# Patient Record
Sex: Male | Born: 1965
Health system: Southern US, Community
[De-identification: ages and names within clinical notes are randomized; demographics above are authoritative.]

## PROBLEM LIST (undated history)

## (undated) DIAGNOSIS — M7500 Adhesive capsulitis of unspecified shoulder: Secondary | ICD-10-CM

## (undated) DIAGNOSIS — K8010 Calculus of gallbladder with chronic cholecystitis without obstruction: Secondary | ICD-10-CM

## (undated) DIAGNOSIS — F419 Anxiety disorder, unspecified: Secondary | ICD-10-CM

## (undated) DIAGNOSIS — K5900 Constipation, unspecified: Secondary | ICD-10-CM

## (undated) DIAGNOSIS — K219 Gastro-esophageal reflux disease without esophagitis: Secondary | ICD-10-CM

## (undated) DIAGNOSIS — D6851 Activated protein C resistance: Principal | ICD-10-CM

## (undated) DIAGNOSIS — T7840XA Allergy, unspecified, initial encounter: Secondary | ICD-10-CM

## (undated) DIAGNOSIS — K649 Unspecified hemorrhoids: Secondary | ICD-10-CM

## (undated) DIAGNOSIS — D649 Anemia, unspecified: Secondary | ICD-10-CM

## (undated) HISTORY — DX: Allergy, unspecified, initial encounter: T78.40XA

## (undated) HISTORY — DX: Adhesive capsulitis of unspecified shoulder: M75.00

## (undated) HISTORY — PX: GALLBLADDER SURGERY: SHX652

## (undated) HISTORY — PX: OTHER SURGICAL HISTORY: SHX169

## (undated) HISTORY — DX: Activated protein C resistance: D68.51

## (undated) HISTORY — PX: WISDOM TOOTH EXTRACTION: SHX21

## (undated) HISTORY — DX: Unspecified hemorrhoids: K64.9

## (undated) HISTORY — DX: Anxiety disorder, unspecified: F41.9

## (undated) HISTORY — DX: Gastro-esophageal reflux disease without esophagitis: K21.9

## (undated) HISTORY — PX: ANKLE FRACTURE SURGERY: SHX122

---

## 2004-11-22 ENCOUNTER — Ambulatory Visit: Payer: Self-pay | Admitting: Family Medicine

## 2005-03-17 ENCOUNTER — Ambulatory Visit: Payer: Self-pay | Admitting: Family Medicine

## 2005-12-01 ENCOUNTER — Ambulatory Visit: Payer: Self-pay | Admitting: Family Medicine

## 2006-05-18 ENCOUNTER — Ambulatory Visit: Payer: Self-pay | Admitting: Family Medicine

## 2006-07-18 ENCOUNTER — Ambulatory Visit: Payer: Self-pay | Admitting: Family Medicine

## 2006-09-10 ENCOUNTER — Ambulatory Visit: Payer: Self-pay | Admitting: Family Medicine

## 2006-12-24 ENCOUNTER — Ambulatory Visit: Payer: Self-pay | Admitting: Family Medicine

## 2008-02-04 ENCOUNTER — Ambulatory Visit: Payer: Self-pay | Admitting: Family Medicine

## 2008-02-04 DIAGNOSIS — J309 Allergic rhinitis, unspecified: Secondary | ICD-10-CM | POA: Insufficient documentation

## 2008-02-04 DIAGNOSIS — N453 Epididymo-orchitis: Secondary | ICD-10-CM | POA: Insufficient documentation

## 2008-02-19 ENCOUNTER — Ambulatory Visit: Payer: Self-pay | Admitting: Family Medicine

## 2008-02-19 DIAGNOSIS — M25559 Pain in unspecified hip: Secondary | ICD-10-CM | POA: Insufficient documentation

## 2008-03-12 ENCOUNTER — Ambulatory Visit: Payer: Self-pay | Admitting: Family Medicine

## 2008-03-12 DIAGNOSIS — K219 Gastro-esophageal reflux disease without esophagitis: Secondary | ICD-10-CM | POA: Insufficient documentation

## 2008-03-12 DIAGNOSIS — R109 Unspecified abdominal pain: Secondary | ICD-10-CM | POA: Insufficient documentation

## 2008-03-12 DIAGNOSIS — F411 Generalized anxiety disorder: Secondary | ICD-10-CM | POA: Insufficient documentation

## 2008-03-12 LAB — CONVERTED CEMR LAB
Bilirubin Urine: NEGATIVE
Nitrite: NEGATIVE
Urobilinogen, UA: 0.2

## 2008-03-16 ENCOUNTER — Telehealth: Payer: Self-pay | Admitting: Family Medicine

## 2008-03-17 ENCOUNTER — Encounter: Payer: Self-pay | Admitting: Family Medicine

## 2008-03-17 LAB — CONVERTED CEMR LAB
Alkaline Phosphatase: 66 units/L (ref 39–117)
Basophils Absolute: 0 10*3/uL (ref 0.0–0.1)
Bilirubin, Direct: 0.1 mg/dL (ref 0.0–0.3)
Calcium: 9.2 mg/dL (ref 8.4–10.5)
Cholesterol: 194 mg/dL (ref 0–200)
GFR calc Af Amer: 120 mL/min
GFR calc non Af Amer: 99 mL/min
Glucose, Bld: 92 mg/dL (ref 70–99)
HCT: 41.7 % (ref 39.0–52.0)
HDL: 39.5 mg/dL (ref >39.0)
Hemoglobin: 13.8 g/dL (ref 13.0–17.0)
LDL Cholesterol: 139 mg/dL — ABNORMAL HIGH (ref 0–99)
MCHC: 33.2 g/dL (ref 30.0–36.0)
Monocytes Absolute: 0.4 10*3/uL (ref 0.2–0.7)
Monocytes Relative: 11.4 % — ABNORMAL HIGH (ref 3.0–11.0)
Neutro Abs: 1.6 10*3/uL (ref 1.4–7.7)
Platelets: 240 10*3/uL (ref 150–400)
Potassium: 4.1 meq/L (ref 3.5–5.1)
RDW: 15.7 % — ABNORMAL HIGH (ref 11.5–14.6)
Sodium: 136 meq/L (ref 135–145)
Total Bilirubin: 0.7 mg/dL (ref 0.3–1.2)
Total CHOL/HDL Ratio: 4.9
Triglycerides: 76 mg/dL (ref 0–149)

## 2008-03-19 ENCOUNTER — Ambulatory Visit: Payer: Self-pay | Admitting: Internal Medicine

## 2008-03-19 DIAGNOSIS — S40029A Contusion of unspecified upper arm, initial encounter: Secondary | ICD-10-CM | POA: Insufficient documentation

## 2008-04-22 ENCOUNTER — Encounter: Payer: Self-pay | Admitting: Family Medicine

## 2008-06-11 ENCOUNTER — Encounter (INDEPENDENT_AMBULATORY_CARE_PROVIDER_SITE_OTHER): Payer: Self-pay | Admitting: Urology

## 2008-06-11 ENCOUNTER — Ambulatory Visit (HOSPITAL_BASED_OUTPATIENT_CLINIC_OR_DEPARTMENT_OTHER): Admission: RE | Admit: 2008-06-11 | Discharge: 2008-06-11 | Payer: Self-pay | Admitting: Urology

## 2008-10-14 ENCOUNTER — Encounter: Payer: Self-pay | Admitting: Family Medicine

## 2008-10-21 ENCOUNTER — Ambulatory Visit: Payer: Self-pay | Admitting: Family Medicine

## 2008-10-21 DIAGNOSIS — R209 Unspecified disturbances of skin sensation: Secondary | ICD-10-CM | POA: Insufficient documentation

## 2008-12-18 HISTORY — PX: TESTICLE SURGERY: SHX794

## 2009-04-09 ENCOUNTER — Ambulatory Visit: Payer: Self-pay | Admitting: Family Medicine

## 2009-04-09 DIAGNOSIS — IMO0002 Reserved for concepts with insufficient information to code with codable children: Secondary | ICD-10-CM | POA: Insufficient documentation

## 2009-06-09 ENCOUNTER — Ambulatory Visit: Payer: Self-pay | Admitting: Family Medicine

## 2009-06-09 DIAGNOSIS — R1031 Right lower quadrant pain: Secondary | ICD-10-CM | POA: Insufficient documentation

## 2009-06-11 ENCOUNTER — Ambulatory Visit: Payer: Self-pay | Admitting: Cardiology

## 2009-09-09 ENCOUNTER — Telehealth: Payer: Self-pay | Admitting: Family Medicine

## 2009-11-25 ENCOUNTER — Ambulatory Visit: Payer: Self-pay | Admitting: Family Medicine

## 2009-11-25 DIAGNOSIS — J069 Acute upper respiratory infection, unspecified: Secondary | ICD-10-CM | POA: Insufficient documentation

## 2010-10-17 ENCOUNTER — Ambulatory Visit: Payer: Self-pay | Admitting: Family Medicine

## 2011-01-19 NOTE — Assessment & Plan Note (Signed)
Summary: cough//ccm   Vital Signs:  Patient profile:   45 year old male Weight:      174 pounds O2 Sat:      98 % Temp:     98.5 degrees F Pulse rate:   93 / minute BP sitting:   118 / 80  (left arm)  Vitals Entered By: Pura Spice, RN (October 17, 2010 3:21 PM) CC: cough hoarse no stuffiness  Is Patient Diabetic? No   History of Present Illness: Here for 2 reasons. First he wants a refill of Alprazolam. He does not use this often. It has helped but he would like a stronger dose if possible. Also, for 5 days he has had some HAs, PND, St, hoarseness, and a dry cough. No fever.   Allergies: 1)  ! Penicillin 2)  ! * Cashews  Past History:  Past Medical History: Reviewed history from 03/12/2008 and no changes required. Allergic rhinitis GERD gynecomastia  Review of Systems  The patient denies anorexia, fever, weight loss, weight gain, vision loss, decreased hearing, hoarseness, chest pain, syncope, dyspnea on exertion, peripheral edema, hemoptysis, abdominal pain, melena, hematochezia, severe indigestion/heartburn, hematuria, incontinence, genital sores, muscle weakness, suspicious skin lesions, transient blindness, difficulty walking, depression, unusual weight change, abnormal bleeding, enlarged lymph nodes, angioedema, breast masses, and testicular masses.    Physical Exam  General:  Well-developed,well-nourished,in no acute distress; alert,appropriate and cooperative throughout examination Head:  Normocephalic and atraumatic without obvious abnormalities. No apparent alopecia or balding. Eyes:  No corneal or conjunctival inflammation noted. EOMI. Perrla. Funduscopic exam benign, without hemorrhages, exudates or papilledema. Vision grossly normal. Ears:  External ear exam shows no significant lesions or deformities.  Otoscopic examination reveals clear canals, tympanic membranes are intact bilaterally without bulging, retraction, inflammation or discharge. Hearing is grossly  normal bilaterally. Nose:  External nasal examination shows no deformity or inflammation. Nasal mucosa are pink and moist without lesions or exudates. Mouth:  Oral mucosa and oropharynx without lesions or exudates.  Teeth in good repair. Neck:  No deformities, masses, or tenderness noted. Lungs:  Normal respiratory effort, chest expands symmetrically. Lungs are clear to auscultation, no crackles or wheezes. Psych:  Oriented X3, memory intact for recent and remote, normally interactive, good eye contact, and slightly anxious.     Impression & Recommendations:  Problem # 1:  URI (ICD-465.9)  The following medications were removed from the medication list:    Hydromet 5-1.5 Mg/48ml Syrp (Hydrocodone-homatropine) .Marland Kitchen... 1 or 2 tsps at bedtime as needed His updated medication list for this problem includes:    Allegra-d 12 Hour 60-120 Mg Tb12 (Fexofenadine-pseudoephedrine) .Marland Kitchen... 1 by mouth once daily  Problem # 2:  GENERALIZED ANXIETY DISORDER (ICD-300.02)  The following medications were removed from the medication list:    Alprazolam 0.25 Mg Tabs (Alprazolam) .Marland Kitchen... Three times a day as needed anxiety His updated medication list for this problem includes:    Alprazolam 0.5 Mg Tabs (Alprazolam) .Marland Kitchen... Three times a day as needed anxiety  Complete Medication List: 1)  Allegra-d 12 Hour 60-120 Mg Tb12 (Fexofenadine-pseudoephedrine) .Marland Kitchen.. 1 by mouth once daily 2)  Alprazolam 0.5 Mg Tabs (Alprazolam) .... Three times a day as needed anxiety  Patient Instructions: 1)  Please schedule a follow-up appointment as needed .  Prescriptions: ALPRAZOLAM 0.5 MG TABS (ALPRAZOLAM) three times a day as needed anxiety  #60 x 2   Entered and Authorized by:   Nelwyn Salisbury MD   Signed by:   Nelwyn Salisbury MD  on 10/17/2010   Method used:   Print then Give to Patient   RxID:   [id number]    Orders Added: 1)  Est. Patient Level IV [14782]

## 2011-02-03 ENCOUNTER — Encounter: Payer: Self-pay | Admitting: Family Medicine

## 2011-02-03 ENCOUNTER — Ambulatory Visit (INDEPENDENT_AMBULATORY_CARE_PROVIDER_SITE_OTHER): Payer: BC Managed Care – PPO | Admitting: Family Medicine

## 2011-02-03 VITALS — BP 106/74 | HR 88 | Temp 98.5°F | Wt 180.0 lb

## 2011-02-03 DIAGNOSIS — N508 Other specified disorders of male genital organs: Secondary | ICD-10-CM

## 2011-02-03 DIAGNOSIS — N5089 Other specified disorders of the male genital organs: Secondary | ICD-10-CM

## 2011-02-03 NOTE — Progress Notes (Signed)
  Subjective:    Patient ID: Jackson Smith, male    DOB: 08-12-1966, 45 y.o.   MRN: [id number]  HPI Here to check a nontender lump he found near the left testicle several days ago. He has never noticed this before. It does not bother him at all. He did have surgery in this area years ago to correct poor circulation to the testicle.    Review of Systems  Constitutional: Negative.   Gastrointestinal: Negative.   Genitourinary: Positive for scrotal swelling. Negative for dysuria, urgency, penile swelling, difficulty urinating, penile pain and testicular pain.       Objective:   Physical Exam  Constitutional: He appears well-developed and well-nourished.  Genitourinary: Penis normal. Right testis shows no mass, no swelling and no tenderness. Right testis is descended. Cremasteric reflex is not absent on the right side. Left testis shows mass. Left testis shows no swelling and no tenderness. Left testis is descended. Cremasteric reflex is not absent on the left side.       There is a smooth nonmobile nontender cystic mass along the left medial testicle          Assessment & Plan:  This is consistent with an epididymal cyst. We will get an [place] to confirm

## 2011-02-10 ENCOUNTER — Ambulatory Visit
Admission: RE | Admit: 2011-02-10 | Discharge: 2011-02-10 | Disposition: A | Discharge status: Home / Self Care | Payer: BC Managed Care – PPO | Source: Ambulatory Visit | Attending: Family Medicine | Admitting: Family Medicine

## 2011-02-10 DIAGNOSIS — N5089 Other specified disorders of the male genital organs: Secondary | ICD-10-CM

## 2011-02-16 ENCOUNTER — Telehealth: Payer: Self-pay | Admitting: *Deleted

## 2011-02-16 NOTE — Telephone Encounter (Signed)
Left message to call back  

## 2011-02-16 NOTE — Telephone Encounter (Signed)
Message copied by Tor Netters on Thu Feb 16, 2011  3:51 PM ------      Message from: Dwaine Deter      Created: Mon Feb 13, 2011 11:23 AM       Normal with a small hydrocele (fluid sac) on the left side, as expected. No treatment needed.

## 2011-02-17 NOTE — Telephone Encounter (Signed)
Pt aware of results 

## 2011-02-17 NOTE — Telephone Encounter (Signed)
Left message to call back  

## 2011-03-03 ENCOUNTER — Encounter: Payer: Self-pay | Admitting: Family Medicine

## 2011-03-06 ENCOUNTER — Other Ambulatory Visit: Payer: BC Managed Care – PPO

## 2011-03-06 ENCOUNTER — Other Ambulatory Visit: Payer: Self-pay | Admitting: Family Medicine

## 2011-03-06 ENCOUNTER — Encounter (INDEPENDENT_AMBULATORY_CARE_PROVIDER_SITE_OTHER): Payer: Self-pay | Admitting: *Deleted

## 2011-03-06 DIAGNOSIS — Z0389 Encounter for observation for other suspected diseases and conditions ruled out: Secondary | ICD-10-CM

## 2011-03-06 DIAGNOSIS — E785 Hyperlipidemia, unspecified: Secondary | ICD-10-CM

## 2011-03-06 DIAGNOSIS — Z0000 Encounter for general adult medical examination without abnormal findings: Secondary | ICD-10-CM

## 2011-03-06 LAB — TSH: TSH: 1.84 u[IU]/mL (ref 0.35–5.50)

## 2011-03-06 LAB — URINALYSIS
Leukocytes, UA: NEGATIVE
Nitrite: NEGATIVE
Specific Gravity, Urine: >=1.03 (ref 1.000–1.030)
Urine Glucose: NEGATIVE
Urobilinogen, UA: 0.2 (ref 0.0–1.0)

## 2011-03-06 LAB — CBC WITH DIFFERENTIAL/PLATELET
Basophils Absolute: 0 10*3/uL (ref 0.0–0.1)
HCT: 36.6 % — ABNORMAL LOW (ref 39.0–52.0)
Hemoglobin: 12.2 g/dL — ABNORMAL LOW (ref 13.0–17.0)
Lymphs Abs: 1.5 10*3/uL (ref 0.7–4.0)
MCHC: 33.4 g/dL (ref 30.0–36.0)
MCV: 83.8 fl (ref 78.0–100.0)
Monocytes Absolute: 0.4 10*3/uL (ref 0.1–1.0)
Monocytes Relative: 9.8 % (ref 3.0–12.0)
Neutro Abs: 2 10*3/uL (ref 1.4–7.7)
Platelets: 235 10*3/uL (ref 150.0–400.0)
RDW: 16.6 % — ABNORMAL HIGH (ref 11.5–14.6)

## 2011-03-06 LAB — BASIC METABOLIC PANEL
BUN: 14 mg/dL (ref 6–23)
CO2: 29 mEq/L (ref 19–32)
Chloride: 101 mEq/L (ref 96–112)
GFR: 98.54 mL/min (ref >60.00)
Glucose, Bld: 88 mg/dL (ref 70–99)
Potassium: 4.3 mEq/L (ref 3.5–5.1)
Sodium: 135 mEq/L (ref 135–145)

## 2011-03-06 LAB — LDL CHOLESTEROL, DIRECT: Direct LDL: 124.5 mg/dL

## 2011-03-06 LAB — PSA: PSA: 0.3 ng/mL (ref 0.10–4.00)

## 2011-03-06 LAB — HEPATIC FUNCTION PANEL
ALT: 16 U/L (ref 0–53)
AST: 18 U/L (ref 0–37)
Alkaline Phosphatase: 61 U/L (ref 39–117)
Total Bilirubin: 0.3 mg/dL (ref 0.3–1.2)

## 2011-03-06 LAB — LIPID PANEL
Total CHOL/HDL Ratio: 3
VLDL: 15.6 mg/dL (ref 0.0–40.0)

## 2011-03-13 ENCOUNTER — Encounter: Payer: Self-pay | Admitting: Family Medicine

## 2011-03-13 ENCOUNTER — Telehealth: Payer: Self-pay

## 2011-03-13 ENCOUNTER — Ambulatory Visit (INDEPENDENT_AMBULATORY_CARE_PROVIDER_SITE_OTHER): Payer: BC Managed Care – PPO | Admitting: Family Medicine

## 2011-03-13 VITALS — BP 110/64 | HR 88 | Ht 67.5 in | Wt 176.0 lb

## 2011-03-13 DIAGNOSIS — Z0000 Encounter for general adult medical examination without abnormal findings: Secondary | ICD-10-CM

## 2011-03-13 NOTE — Telephone Encounter (Signed)
Message copied by Madison Hickman on Mon Mar 13, 2011 10:03 AM ------      Message from: Dwaine Deter      Created: Fri Mar 10, 2011  5:35 PM       Normal except mildly high chol. Watch the diet

## 2011-03-13 NOTE — Progress Notes (Signed)
  Subjective:    Patient ID: Jackson Smith, male    DOB: 28-Dec-1965, 45 y.o.   MRN: [id number]  HPI 45 yr old male for a cpx. He feels well with no complaints. He seldom uses any Xanax, and he sleeps well.    Review of Systems  Constitutional: Negative.   HENT: Negative.   Eyes: Negative.   Respiratory: Negative.   Cardiovascular: Negative.   Gastrointestinal: Negative.   Genitourinary: Negative.   Musculoskeletal: Negative.   Skin: Negative.   Neurological: Negative.   Hematological: Negative.   Psychiatric/Behavioral: Negative.        Objective:   Physical Exam  Constitutional: He is oriented to person, place, and time. He appears well-developed and well-nourished. No distress.  HENT:  Head: Normocephalic and atraumatic.  Right Ear: External ear normal.  Left Ear: External ear normal.  Nose: Nose normal.  Mouth/Throat: Oropharynx is clear and moist. No oropharyngeal exudate.  Eyes: Conjunctivae and EOM are normal. Pupils are equal, round, and reactive to light. Right eye exhibits no discharge. Left eye exhibits no discharge. No scleral icterus.  Neck: Neck supple. No JVD present. No tracheal deviation present. No thyromegaly present.  Cardiovascular: Normal rate, regular rhythm, normal heart sounds and intact distal pulses.  Exam reveals no gallop and no friction rub.   No murmur heard. Pulmonary/Chest: Effort normal and breath sounds normal. No respiratory distress. He has no wheezes. He has no rales. He exhibits no tenderness.  Abdominal: Soft. Bowel sounds are normal. He exhibits no distension and no mass. There is no tenderness. There is no rebound and no guarding.  Genitourinary: Rectum normal, prostate normal and penis normal. Guaiac negative stool. No penile tenderness.  Musculoskeletal: Normal range of motion. He exhibits no edema and no tenderness.  Lymphadenopathy:    He has no cervical adenopathy.  Neurological: He is alert and oriented to person, place, and  time. He has normal reflexes. No cranial nerve deficit. He exhibits normal muscle tone. Coordination normal.  Skin: Skin is warm and dry. No rash noted. He is not diaphoretic. No erythema. No pallor.  Psychiatric: He has a normal mood and affect. His behavior is normal. Judgment and thought content normal.          Assessment & Plan:  Discussed diet and exercise.

## 2011-03-13 NOTE — Telephone Encounter (Signed)
Pt aware labs

## 2011-05-02 NOTE — Op Note (Signed)
NAMEFREAD, Jackson Smith             ACCOUNT NO.:  [id number]   MEDICAL RECORD NO.:  [id number]          PATIENT TYPE:  AMB   LOCATION:  [facility]                         FACILITY:  [facility]   PHYSICIAN:  Sigmund I. Patsi Sears, M.D.DATE OF BIRTH:  [birth year]   DATE OF PROCEDURE:  06/11/2008  DATE OF DISCHARGE:                               OPERATIVE REPORT   PREOPERATIVE DIAGNOSES:  Chronic left testicular pain secondary to left  varicocele.   POSTOPERATIVE DIAGNOSES:  Chronic left testicular pain secondary to left  varicocele.   OPERATION:  Left internal splenic vein ligation.   SURGEON:  Sigmund I. Patsi Sears, M.D.   ANESTHESIA:  General LMA.   PREPARATION:  After appropriate preanesthesia, the patient was brought  to the operating room, placed on the operating room table in dorsal  supine position where general LMA anesthesia was induced.  The left  scrotum and inguinal canal were shaved, prepped with Betadine solution,  and draped in usual fashion.  The patient is given IV antibiotic prior  to the incision.   REVIEW OF HISTORY:  This 45 year old male has history of left groin  pain, after he has been on his feet all day.  Evaluation with ultrasound  shows the patient has left varicocele present.  The patient is now for  left internal splenic vein ligation.   PROCEDURE:  The left inguinal canal was marked preoperatively.  At  surgery, line of incision was made, two fingerbreadths medial to the  iliac crest on the left side, and the left scrotum.  Incision measures  approximately 4 to 5 cm, beginning at the pubic tubercle.  Subcutaneous  tissue was dissected with electrosurgery unit, and several large veins  were identified, and these were ligated with 3-0 Vicryl sutures.  The  spermatic cord is identified, external to the inguinal ring.  At the  level of pubic tubercle, the spermatic cord was dissected, and a Penrose  drain placed underneath it.  The vas was identified within the  spermatic  cord, and this was isolated.  Pulsating artery is identified at the  beginning of the procedure, and at the end of the procedure as well.   The spermatic cord is then dissected, and multiple large veins  identified.  These were individually ligated and a specimen of internal  splenic vein removed, sent to the laboratory for examination.   Marcaine 0.25 plain is injected proximally and distally in the spermatic  cord, and in the wound edges.   The Scarpa's fascia was then closed with 3-0 Vicryl suture, and the  wound is closed with skin stapler.  Sterile dressings applied, the  patient was awakened and taken to recovery room in good condition.      Sigmund I. Patsi Sears, M.D.  Electronically Signed     SIT/MEDQ  D:  06/11/2008  T:  06/11/2008  Job:  [id number]   cc:   Jeannett Senior A. Clent Ridges, MD  [address] [place]  [place] [address]

## 2011-05-29 ENCOUNTER — Encounter: Payer: Self-pay | Admitting: Internal Medicine

## 2011-05-29 ENCOUNTER — Ambulatory Visit (INDEPENDENT_AMBULATORY_CARE_PROVIDER_SITE_OTHER): Payer: BC Managed Care – PPO | Admitting: Internal Medicine

## 2011-05-29 DIAGNOSIS — J309 Allergic rhinitis, unspecified: Secondary | ICD-10-CM

## 2011-05-29 DIAGNOSIS — J069 Acute upper respiratory infection, unspecified: Secondary | ICD-10-CM

## 2011-05-29 NOTE — Patient Instructions (Signed)
ZUTRIPRO  1 teaspoon every 6-8 hours for cough  Get plenty of rest, Drink lots of  clear liquids, and use Tylenol or ibuprofen for fever and discomfort.    Call or return to clinic prn if these symptoms worsen or fail to improve as anticipated.

## 2011-05-29 NOTE — Progress Notes (Signed)
  Subjective:    Patient ID: Jackson Smith, male    DOB: [birth year], 45 y.o.   MRN: [id number]  HPI  45 year old patient who has a history of allergic rhinitis. For the past 4 days has had dry nonproductive cough some nasal congestion and mild postnasal drip. There's been no fever. He has been using Allegra-D with some benefit. She complained his cough    Review of Systems  Constitutional: Negative for fever, chills, appetite change and fatigue.  HENT: Positive for rhinorrhea and postnasal drip. Negative for hearing loss, ear pain, congestion, sore throat, trouble swallowing, neck stiffness, dental problem, voice change and tinnitus.   Eyes: Negative for pain, discharge and visual disturbance.  Respiratory: Positive for cough. Negative for chest tightness, wheezing and stridor.   Cardiovascular: Negative for chest pain, palpitations and leg swelling.  Gastrointestinal: Negative for nausea, vomiting, abdominal pain, diarrhea, constipation, blood in stool and abdominal distention.  Genitourinary: Negative for urgency, hematuria, flank pain, discharge, difficulty urinating and genital sores.  Musculoskeletal: Negative for myalgias, back pain, joint swelling, arthralgias and gait problem.  Skin: Negative for rash.  Neurological: Negative for dizziness, syncope, speech difficulty, weakness, numbness and headaches.  Hematological: Negative for adenopathy. Does not bruise/bleed easily.  Psychiatric/Behavioral: Negative for behavioral problems and dysphoric mood. The patient is not nervous/anxious.        Objective:   Physical Exam  Constitutional: He is oriented to person, place, and time. He appears well-developed.  HENT:  Head: Normocephalic.  Right Ear: External ear normal.  Left Ear: External ear normal.  Eyes: Conjunctivae and EOM are normal.  Neck: Normal range of motion.  Cardiovascular: Normal rate and normal heart sounds.   Pulmonary/Chest: Breath sounds normal.  Abdominal:  Bowel sounds are normal.  Musculoskeletal: Normal range of motion. He exhibits no edema and no tenderness.  Neurological: He is alert and oriented to person, place, and time.  Psychiatric: He has a normal mood and affect. His behavior is normal.          Assessment & Plan:   Viral URI. Will treat symptomatically with expectorants and antitussives. Tylenol when necessary

## 2011-08-16 ENCOUNTER — Encounter: Payer: Self-pay | Admitting: Family Medicine

## 2011-08-16 ENCOUNTER — Ambulatory Visit (INDEPENDENT_AMBULATORY_CARE_PROVIDER_SITE_OTHER): Payer: BC Managed Care – PPO | Admitting: Family Medicine

## 2011-08-16 VITALS — BP 106/76 | HR 80 | Temp 98.1°F | Wt 179.0 lb

## 2011-08-16 DIAGNOSIS — G629 Polyneuropathy, unspecified: Secondary | ICD-10-CM

## 2011-08-16 DIAGNOSIS — G589 Mononeuropathy, unspecified: Secondary | ICD-10-CM

## 2011-08-16 DIAGNOSIS — K5900 Constipation, unspecified: Secondary | ICD-10-CM

## 2011-08-16 NOTE — Progress Notes (Signed)
  Subjective:    Patient ID: Jackson Smith, male    DOB: [birth year], 45 y.o.   MRN: [id number]  HPI Here for several days of odd flushing sensations in his face, hands, and thighs. No visible redness or rashes, no sweats. No recent medication changes. He also mentions several months of fatigue, which is unusual for him. He also asks about help with constipation from time to time. He has tried fiber cereals, Metamucil, and MOM but these do not help much. He tends to feel bloated a lot. No heartburn or nausea.    Review of Systems  Constitutional: Positive for fatigue. Negative for fever, chills, diaphoresis and unexpected weight change.  Respiratory: Negative.   Cardiovascular: Negative.   Gastrointestinal: Positive for constipation.  Skin: Negative.   Neurological: Negative.        Objective:   Physical Exam  Constitutional: He appears well-developed and well-nourished.  Neck: No thyromegaly present.  Cardiovascular: Normal rate, regular rhythm, normal heart sounds and intact distal pulses.   Pulmonary/Chest: Effort normal and breath sounds normal.  Abdominal: Soft. Bowel sounds are normal. He exhibits no distension and no mass. There is no tenderness. There is no rebound and no guarding.  Lymphadenopathy:    He has no cervical adenopathy.  Neurological: He is alert. No cranial nerve deficit. He exhibits normal muscle tone. Coordination normal.  Skin: Skin is warm and dry. No rash noted. No erythema. No pallor.          Assessment & Plan:  Try Miralax and Align  daily. Check labs including B12 for sources of neuropathy

## 2011-08-17 LAB — BASIC METABOLIC PANEL
BUN: 13 mg/dL (ref 6–23)
Calcium: 9.3 mg/dL (ref 8.4–10.5)
Creatinine, Ser: 1 mg/dL (ref 0.4–1.5)
GFR: 90.11 mL/min (ref >60.00)

## 2011-08-17 LAB — VITAMIN B12: Vitamin B-12: 472 pg/mL (ref 211–911)

## 2011-08-17 LAB — TSH: TSH: 1.15 u[IU]/mL (ref 0.35–5.50)

## 2011-08-18 ENCOUNTER — Telehealth: Payer: Self-pay | Admitting: Family Medicine

## 2011-08-18 NOTE — Telephone Encounter (Signed)
Requesting his recent lab results. Thanks.

## 2011-08-18 NOTE — Telephone Encounter (Signed)
Labs put in mail today °

## 2011-08-22 NOTE — Progress Notes (Signed)
Left a message for pt to return call 

## 2011-09-13 ENCOUNTER — Ambulatory Visit (INDEPENDENT_AMBULATORY_CARE_PROVIDER_SITE_OTHER): Payer: BC Managed Care – PPO | Admitting: Family Medicine

## 2011-09-13 ENCOUNTER — Encounter: Payer: Self-pay | Admitting: Family Medicine

## 2011-09-13 VITALS — BP 110/78 | HR 75 | Temp 98.5°F | Wt 180.0 lb

## 2011-09-13 DIAGNOSIS — J039 Acute tonsillitis, unspecified: Secondary | ICD-10-CM

## 2011-09-13 MED ORDER — LEVOFLOXACIN 500 MG PO TABS: 500.0000 mg | ORAL_TABLET | Freq: Every day | ORAL | Status: AC

## 2011-09-13 NOTE — Progress Notes (Signed)
  Subjective:    Patient ID: Jackson Smith, male    DOB: 1966/08/07, 45 y.o.   MRN: [id number]  HPI Here for 10 days of a ST on the left side of the throat. No fever or HA or cough. Using Motrin.    Review of Systems  Constitutional: Negative.   HENT: Positive for sore throat. Negative for congestion, postnasal drip and sinus pressure.   Eyes: Negative.   Respiratory: Negative.        Objective:   Physical Exam  Constitutional: He appears well-developed and well-nourished.  HENT:  Right Ear: External ear normal.  Left Ear: External ear normal.  Nose: Nose normal.  Mouth/Throat: No oropharyngeal exudate.       The left tonsil is swollen and red   Neck: Normal range of motion. Neck supple. No thyromegaly present.  Pulmonary/Chest: Effort normal and breath sounds normal.  Lymphadenopathy:    He has no cervical adenopathy.          Assessment & Plan:  Recheck prn

## 2011-09-14 LAB — POCT HEMOGLOBIN-HEMACUE: Operator id: 268271

## 2011-11-07 ENCOUNTER — Telehealth: Payer: Self-pay | Admitting: Family Medicine

## 2011-11-07 NOTE — Telephone Encounter (Signed)
LMTCB

## 2011-11-07 NOTE — Telephone Encounter (Signed)
Advised Urgent Care.

## 2011-11-07 NOTE — Telephone Encounter (Signed)
Pt called and said that he is having pain in chest when he exhales or inhales. No cough or congestion. This started today. Pt says that he feels fine,otherwise. Pls call asap.

## 2012-03-06 ENCOUNTER — Other Ambulatory Visit (INDEPENDENT_AMBULATORY_CARE_PROVIDER_SITE_OTHER): Payer: BC Managed Care – PPO

## 2012-03-06 DIAGNOSIS — Z0000 Encounter for general adult medical examination without abnormal findings: Secondary | ICD-10-CM

## 2012-03-06 LAB — CBC WITH DIFFERENTIAL/PLATELET
Eosinophils Relative: 2.6 % (ref 0.0–5.0)
Monocytes Relative: 10.7 % (ref 3.0–12.0)
Neutrophils Relative %: 42.7 % — ABNORMAL LOW (ref 43.0–77.0)
Platelets: 229 10*3/uL (ref 150.0–400.0)
WBC: 3.5 10*3/uL — ABNORMAL LOW (ref 4.5–10.5)

## 2012-03-06 LAB — POCT URINALYSIS DIPSTICK
Protein, UA: NEGATIVE
Spec Grav, UA: 1.025
Urobilinogen, UA: 0.2
pH, UA: 6

## 2012-03-06 LAB — LIPID PANEL
Cholesterol: 195 mg/dL (ref 0–200)
HDL: 53.1 mg/dL (ref >39.00)
LDL Cholesterol: 130 mg/dL — ABNORMAL HIGH (ref 0–99)
VLDL: 11.8 mg/dL (ref 0.0–40.0)

## 2012-03-06 LAB — HEPATIC FUNCTION PANEL
ALT: 26 U/L (ref 0–53)
AST: 25 U/L (ref 0–37)
Bilirubin, Direct: 0 mg/dL (ref 0.0–0.3)
Total Bilirubin: 0.5 mg/dL (ref 0.3–1.2)

## 2012-03-06 LAB — BASIC METABOLIC PANEL
BUN: 15 mg/dL (ref 6–23)
Calcium: 8.9 mg/dL (ref 8.4–10.5)
Creatinine, Ser: 0.8 mg/dL (ref 0.4–1.5)
GFR: 110.94 mL/min (ref >60.00)

## 2012-03-07 ENCOUNTER — Telehealth: Payer: Self-pay | Admitting: Family Medicine

## 2012-03-07 NOTE — Progress Notes (Signed)
Quick Note:  Left a message for pt to return call. ______ 

## 2012-03-07 NOTE — Telephone Encounter (Signed)
Pt is callback concerning blood work results

## 2012-03-08 ENCOUNTER — Encounter: Payer: Self-pay | Admitting: Family Medicine

## 2012-03-08 NOTE — Telephone Encounter (Signed)
Tried to reach pt, no answer. I put a copy of results in mail.

## 2012-03-08 NOTE — Progress Notes (Signed)
Quick Note:  Tried to reach pt by phone, no answer, put a copy of results in mail. ______ 

## 2012-03-13 ENCOUNTER — Encounter: Payer: Self-pay | Admitting: Family Medicine

## 2012-03-13 ENCOUNTER — Ambulatory Visit (INDEPENDENT_AMBULATORY_CARE_PROVIDER_SITE_OTHER): Payer: BC Managed Care – PPO | Admitting: Family Medicine

## 2012-03-13 VITALS — BP 112/78 | HR 80 | Temp 98.7°F | Ht 68.5 in | Wt 182.0 lb

## 2012-03-13 DIAGNOSIS — Z0000 Encounter for general adult medical examination without abnormal findings: Secondary | ICD-10-CM

## 2012-03-13 MED ORDER — ALPRAZOLAM 0.5 MG PO TABS: 0.5000 mg | ORAL_TABLET | Freq: Two times a day (BID) | ORAL | Status: DC | PRN

## 2012-03-13 NOTE — Progress Notes (Signed)
  Subjective:    Patient ID: Jackson Smith, male    DOB: 28-Dec-1965, 46 y.o.   MRN: [id number]  HPI 46 yr old male for a cpx. He feels well and has no concerns. He walks 3 miles a day and watched his diet.    Review of Systems  Constitutional: Negative.   HENT: Negative.   Eyes: Negative.   Respiratory: Negative.   Cardiovascular: Negative.   Gastrointestinal: Negative.   Genitourinary: Negative.   Musculoskeletal: Negative.   Skin: Negative.   Neurological: Negative.   Hematological: Negative.   Psychiatric/Behavioral: Negative.        Objective:   Physical Exam  Constitutional: He is oriented to person, place, and time. He appears well-developed and well-nourished. No distress.  HENT:  Head: Normocephalic and atraumatic.  Right Ear: External ear normal.  Left Ear: External ear normal.  Nose: Nose normal.  Mouth/Throat: Oropharynx is clear and moist. No oropharyngeal exudate.  Eyes: Conjunctivae and EOM are normal. Pupils are equal, round, and reactive to light. Right eye exhibits no discharge. Left eye exhibits no discharge. No scleral icterus.  Neck: Neck supple. No JVD present. No tracheal deviation present. No thyromegaly present.  Cardiovascular: Normal rate, regular rhythm, normal heart sounds and intact distal pulses.  Exam reveals no gallop and no friction rub.   No murmur heard. Pulmonary/Chest: Effort normal and breath sounds normal. No respiratory distress. He has no wheezes. He has no rales. He exhibits no tenderness.  Abdominal: Soft. Bowel sounds are normal. He exhibits no distension and no mass. There is no tenderness. There is no rebound and no guarding.  Genitourinary: Rectum normal, prostate normal and penis normal. Guaiac negative stool. No penile tenderness.  Musculoskeletal: Normal range of motion. He exhibits no edema and no tenderness.  Lymphadenopathy:    He has no cervical adenopathy.  Neurological: He is alert and oriented to person, place, and  time. He has normal reflexes. No cranial nerve deficit. He exhibits normal muscle tone. Coordination normal.  Skin: Skin is warm and dry. No rash noted. He is not diaphoretic. No erythema. No pallor.  Psychiatric: He has a normal mood and affect. His behavior is normal. Judgment and thought content normal.          Assessment & Plan:  Well exam.

## 2012-05-21 ENCOUNTER — Ambulatory Visit (INDEPENDENT_AMBULATORY_CARE_PROVIDER_SITE_OTHER): Payer: BC Managed Care – PPO | Admitting: Family Medicine

## 2012-05-21 ENCOUNTER — Encounter: Payer: Self-pay | Admitting: Family Medicine

## 2012-05-21 VITALS — BP 110/76 | HR 85 | Temp 97.9°F | Wt 180.0 lb

## 2012-05-21 DIAGNOSIS — J40 Bronchitis, not specified as acute or chronic: Secondary | ICD-10-CM

## 2012-05-21 MED ORDER — HYDROCODONE-HOMATROPINE 5-1.5 MG/5ML PO SYRP: 5.0000 mL | ORAL_SOLUTION | ORAL | Status: AC | PRN

## 2012-05-21 MED ORDER — AZITHROMYCIN 250 MG PO TABS: ORAL_TABLET | ORAL | Status: DC

## 2012-05-21 NOTE — Progress Notes (Signed)
  Subjective:    Patient ID: Jackson Smith, male    DOB: October 22, 1966, 46 y.o.   MRN: [id number]  HPI Here for 4 days of chest tightness and coughing up yellow sputum. No fever. Drinking fluids.    Review of Systems  Constitutional: Negative.   HENT: Negative.   Eyes: Negative.   Respiratory: Positive for cough, chest tightness and wheezing.        Objective:   Physical Exam  Constitutional: He appears well-developed and well-nourished.  Pulmonary/Chest: Effort normal. No respiratory distress. He has no rales.       Scattered wheezes and rhonchi           Assessment & Plan:  Add Mucinex

## 2012-05-30 ENCOUNTER — Ambulatory Visit (INDEPENDENT_AMBULATORY_CARE_PROVIDER_SITE_OTHER): Payer: BC Managed Care – PPO | Admitting: Family Medicine

## 2012-05-30 ENCOUNTER — Encounter: Payer: Self-pay | Admitting: Family Medicine

## 2012-05-30 VITALS — BP 116/74 | HR 87 | Temp 98.4°F | Wt 180.0 lb

## 2012-05-30 DIAGNOSIS — K645 Perianal venous thrombosis: Secondary | ICD-10-CM

## 2012-05-30 NOTE — Progress Notes (Signed)
  Subjective:    Patient ID: Jackson Smith, male    DOB: May 31, 1966, 46 y.o.   MRN: [id number]  HPI Here for a firm non-tender lump he discovered near his anus while taking a shower yesterday. No pain or bleeding. He did have to strain to push out a BM 2 days ago.    Review of Systems  Constitutional: Negative.   Gastrointestinal: Negative.        Objective:   Physical Exam  Constitutional: He appears well-developed and well-nourished.  Genitourinary:       Small non-tender thrombosed external hemorrhoid at the anal verge           Assessment & Plan:  Thrombosed hemorrhoid. Since this is not symptomatic we decided against lancing it today. He will use Preparation H and warm sitz baths. Recheck prn

## 2012-09-20 ENCOUNTER — Ambulatory Visit (INDEPENDENT_AMBULATORY_CARE_PROVIDER_SITE_OTHER): Payer: BC Managed Care – PPO | Admitting: Family Medicine

## 2012-09-20 ENCOUNTER — Encounter: Payer: Self-pay | Admitting: Family Medicine

## 2012-09-20 VITALS — BP 108/68 | HR 92 | Temp 98.4°F | Wt 185.0 lb

## 2012-09-20 DIAGNOSIS — M5430 Sciatica, unspecified side: Secondary | ICD-10-CM

## 2012-09-20 DIAGNOSIS — D6859 Other primary thrombophilia: Secondary | ICD-10-CM

## 2012-09-20 DIAGNOSIS — D6851 Activated protein C resistance: Secondary | ICD-10-CM

## 2012-09-20 DIAGNOSIS — R350 Frequency of micturition: Secondary | ICD-10-CM

## 2012-09-20 LAB — POCT URINALYSIS DIPSTICK
Ketones, UA: NEGATIVE
Protein, UA: NEGATIVE
Spec Grav, UA: 1.025
Urobilinogen, UA: 0.2
pH, UA: 6

## 2012-09-20 MED ORDER — DICLOFENAC SODIUM 75 MG PO TBEC: 75.0000 mg | DELAYED_RELEASE_TABLET | Freq: Two times a day (BID) | ORAL | Status: DC

## 2012-09-20 NOTE — Addendum Note (Signed)
Addended by: Aniceto Boss A on: 09/20/2012 12:50 PM   Modules accepted: Orders

## 2012-09-20 NOTE — Progress Notes (Signed)
  Subjective:    Patient ID: Jackson Smith, male    DOB: 19-Mar-1966, 46 y.o.   MRN: [id number]  HPI Here for 3 days of intermittent dull pains in the left buttock which radiate down the left leg. No back pain. No recent trauma.    Review of Systems  Constitutional: Negative.   Gastrointestinal: Negative.   Genitourinary: Negative.   Musculoskeletal: Positive for myalgias. Negative for back pain, joint swelling, arthralgias and gait problem.       Objective:   Physical Exam  Constitutional: He appears well-developed and well-nourished. No distress.  Musculoskeletal:       The lower spine is unremarkable. Tender over the left sciatic notch. Negative SLR          Assessment & Plan:  Rest, heat, Diclofenac prn.

## 2012-11-01 ENCOUNTER — Telehealth: Payer: Self-pay | Admitting: Family Medicine

## 2012-11-01 DIAGNOSIS — Z832 Family history of diseases of the blood and blood-forming organs and certain disorders involving the immune mechanism: Secondary | ICD-10-CM

## 2012-11-01 NOTE — Telephone Encounter (Signed)
Tell him I have put in the order so all he needs to do is schedule the blood draw

## 2012-11-01 NOTE — Telephone Encounter (Signed)
Called pt and schd ordered lab for 11/08/12 as noted.

## 2012-11-01 NOTE — Telephone Encounter (Signed)
Pt called and is req to get an order to have a Factor 5 test done. Pt has a family hx for blood clotting disorder. Pt is req to get this done on next Friday 11/08/12. Pls advise.

## 2012-11-08 ENCOUNTER — Other Ambulatory Visit (INDEPENDENT_AMBULATORY_CARE_PROVIDER_SITE_OTHER): Payer: BC Managed Care – PPO

## 2012-11-08 DIAGNOSIS — Z832 Family history of diseases of the blood and blood-forming organs and certain disorders involving the immune mechanism: Secondary | ICD-10-CM

## 2012-11-12 LAB — FACTOR 5 LEIDEN

## 2012-11-13 NOTE — Progress Notes (Signed)
Quick Note:  I left voice message with results. ______ 

## 2012-11-13 NOTE — Addendum Note (Signed)
Addended by: Gershon Crane A on: 11/13/2012 12:35 PM   Modules accepted: Orders

## 2012-11-15 ENCOUNTER — Telehealth: Payer: Self-pay | Admitting: Oncology

## 2012-11-15 NOTE — Telephone Encounter (Signed)
S/W pt in re NP appt 12/16 @ 3 w/Dr. Gaylyn Rong.  Referring Dr. Clent Ridges Dx-Factor 5 Welcome packet mailed.

## 2012-11-15 NOTE — Telephone Encounter (Signed)
LVOM for pt to return call.  °

## 2012-11-18 ENCOUNTER — Telehealth: Payer: Self-pay | Admitting: Oncology

## 2012-11-18 NOTE — Telephone Encounter (Signed)
C/D 11/18/12 for appt.12/02/12

## 2012-11-30 ENCOUNTER — Encounter: Payer: Self-pay | Admitting: Oncology

## 2012-11-30 DIAGNOSIS — D6851 Activated protein C resistance: Secondary | ICD-10-CM

## 2012-11-30 HISTORY — DX: Activated protein C resistance: D68.51

## 2012-12-02 ENCOUNTER — Encounter: Payer: Self-pay | Admitting: Oncology

## 2012-12-02 ENCOUNTER — Ambulatory Visit: Payer: BC Managed Care – PPO

## 2012-12-02 ENCOUNTER — Other Ambulatory Visit (HOSPITAL_BASED_OUTPATIENT_CLINIC_OR_DEPARTMENT_OTHER): Payer: BC Managed Care – PPO | Admitting: Lab

## 2012-12-02 ENCOUNTER — Ambulatory Visit (HOSPITAL_BASED_OUTPATIENT_CLINIC_OR_DEPARTMENT_OTHER): Payer: BC Managed Care – PPO | Admitting: Oncology

## 2012-12-02 VITALS — BP 117/73 | HR 70 | Temp 97.0°F | Resp 18 | Ht 67.0 in | Wt 185.6 lb

## 2012-12-02 DIAGNOSIS — D6859 Other primary thrombophilia: Secondary | ICD-10-CM

## 2012-12-02 DIAGNOSIS — D6851 Activated protein C resistance: Secondary | ICD-10-CM

## 2012-12-02 LAB — CBC WITH DIFFERENTIAL/PLATELET
Basophils Absolute: 0 10*3/uL (ref 0.0–0.1)
EOS%: 1.4 % (ref 0.0–7.0)
HCT: 39.7 % (ref 38.4–49.9)
HGB: 13.8 g/dL (ref 13.0–17.1)
LYMPH%: 28.8 % (ref 14.0–49.0)
MCH: 30.8 pg (ref 27.2–33.4)
MCV: 88.5 fL (ref 79.3–98.0)
NEUT%: 59.9 % (ref 39.0–75.0)
Platelets: 228 10*3/uL (ref 140–400)
lymph#: 1.7 10*3/uL (ref 0.9–3.3)

## 2012-12-02 LAB — MORPHOLOGY

## 2012-12-02 NOTE — Progress Notes (Signed)
Checked in new pt with no financial concerns. °

## 2012-12-02 NOTE — Progress Notes (Signed)
[facility]  Telephone:([phone]) [phone] [phone]) [phone]     INITIAL HEMATOLOGY CONSULTATION    Referral MD:   Dr. Tera Mater. Clent Ridges, M.D.  Reason for Referral:  Personal history of heterozygous factor V Leiden.   HPI:   [name] is a 46 year-old man without significant PMH.  His maternal aunt had DVT and was tested positive for  factor V Leiden.  Therefore, patient's relatives were advised to have it tested.  His brother and mother tested positive for  factor V Leiden. It was not clear whether it was homozygous or heterozygous.  He was kindly referred to the [facility] for evaluation.   [name] presented to the Clinic by himself today.  He reported feeling well.  He never had leg/calf swelling or pain or chest pain or SOB.   Patient denies fever, anorexia, weight loss, fatigue, headache, visual changes, confusion, drenching night sweats, palpable lymph node swelling, mucositis, odynophagia, dysphagia, nausea vomiting, jaundice, chest pain, palpitation, shortness of breath, dyspnea on exertion, productive cough, gum bleeding, epistaxis, hematemesis, hemoptysis, abdominal pain, abdominal swelling, early satiety, melena, hematuria, skin rash, spontaneous bleeding, joint swelling, joint pain, heat or cold intolerance, bowel bladder incontinence, back pain, focal motor weakness, paresthesia, depression, suicidal or homicidal ideation, feeling hopelessness.  He occasionally see blood on toilet paper when he has constipation and strains for bowel movement.      Past Medical History  Diagnosis Date  . Allergy     seasonal  . Anxiety     panic attack  . GERD (gastroesophageal reflux disease)   . Factor V Leiden mutation 11/30/2012  . Hemorrhoid   :    Past Surgical History  Procedure Date  . Testicle surgery 2010    decreased blood flow    :   CURRENT MEDS: Current Outpatient Prescriptions  Medication Sig Dispense Refill  . ALPRAZolam (XANAX) 0.5  MG tablet Take 1 tablet (0.5 mg total) by mouth 2 (two) times daily as needed for anxiety.  60 tablet  5  . diclofenac (VOLTAREN) 75 MG EC tablet Take 75 mg by mouth as needed.      . fexofenadine-pseudoephedrine (ALLEGRA-D 24) 180-240 MG per 24 hr tablet Take 1 tablet by mouth as needed.           Allergies  Allergen Reactions  . Cashew Nut Oil   . Penicillins   :  Family History  Problem Relation Age of Onset  . Diabetes Father   . Heart attack Father   . Birth defects Brother   . Factor V Leiden deficiency Brother   . Factor V Leiden deficiency Mother   . Factor V Leiden deficiency Maternal Aunt   . Cancer Maternal Uncle 60    colon cancer  :  History   Social History  . Marital Status: Single    Spouse Name: N/A    Number of Children: 0  . Years of Education: N/A   Occupational History  .  [employer]   Social History Main Topics  . Smoking status: Never Smoker   . Smokeless tobacco: Never Used  . Alcohol Use: No  . Drug Use: No  . Sexually Active: Not on file   Other Topics Concern  . Not on file   Social History Narrative  . No narrative on file  :  REVIEW OF SYSTEM:  The rest of the 14-point review of sytem was negative.   Exam: ECOG 0.  General:  well-nourished in no acute distress.  Eyes:  no scleral icterus.  ENT:  There were no oropharyngeal lesions.  Neck was without thyromegaly.  Lymphatics:  Negative cervical, supraclavicular or axillary adenopathy.  Respiratory: lungs were clear bilaterally without wheezing or crackles.  Cardiovascular:  Regular rate and rhythm, S1/S2, without murmur, rub or gallop.  There was no pedal edema.  GI:  abdomen was soft, flat, nontender, nondistended, without organomegaly.  Muscoloskeletal:  no spinal tenderness of palpation of vertebral spine.  Skin exam was without echymosis, petichae.  Neuro exam was nonfocal.  Patient was able to get on and off exam table without assistance.  Gait was normal.  Patient was  alerted and oriented.  Attention was good.   Language was appropriate.  Mood was normal without depression.  Speech was not pressured.  Thought content was not tangential.    LABS:  Lab Results  Component Value Date   WBC 5.9 12/02/2012   HGB 13.8 12/02/2012   HCT 39.7 12/02/2012   PLT 228 12/02/2012   GLUCOSE 89 03/06/2012   CHOL 195 03/06/2012   TRIG 59.0 03/06/2012   HDL 53.10 03/06/2012   LDLDIRECT 124.5 03/06/2011   LDLCALC 130* 03/06/2012   ALT 26 03/06/2012   AST 25 03/06/2012   NA 137 03/06/2012   K 4.0 03/06/2012   CL 102 03/06/2012   CREATININE 0.8 03/06/2012   BUN 15 03/06/2012   CO2 25 03/06/2012   PSA 0.30 03/06/2011    Blood smear review:   I personally reviewed the patient's peripheral blood smear today.  There was isocytosis.  There was no peripheral blast.  There was no schistocytosis, spherocytosis, target cell, rouleaux formation, tear drop cell.  There was no giant platelets or platelet clumps.      ASSESSMENT AND PLAN:   1.  Issue:  Heterozygous factor V Leiden mutation (of of the two copies of this gene was mutated).  Ramification:  Slightly elevated risk of blood clot than general population.  However, without history of DVT or PE,  there is no indication for prophylactic blood thinner since the risk of blood thinner in Heterozygous factor V Leiden mutation is higher than the potential benefit of preventing blood clot.    2.  Recommendation:  Watchful observation.  I discussed with the patient the following teaching points.   *  Watch out for signs of blood clot:  Leg/calf pain/swelling; chest pain, severe shortness of breath.  *  When patient is admitted to hospital or undergo operation especially orthopedic surgery, he will need to be on prophylactic blood thinner.  *  If patient were to have daughters in the future, they may consider refraining from birth control that contain estrogen; since they have 50% chance of carrying this mutation which can significantly  increase the risk of blood clots on estrogen-containing birth control     The length of time of the face-to-face encounter was 30 minutes. More than 50% of time was spent counseling and coordination of care.     Thank you for this referral.      '

## 2012-12-02 NOTE — Patient Instructions (Addendum)
1.  Issue:  Heterozygous factor V Leiden mutation (of of the two copies of this gene was mutated).  Ramification:  Slightly elevated risk of blood clot than general population.  However, without history of blood clot, there is no indication for prophylactic blood thinner since the risk of blood thinner in Heterozygous factor V Leiden mutation is higher than the potential benefit of preventing blood clot.    2.  Recommendation:  Watchful observation.  *  Watch out for signs of blood clot:  Leg/calf pain/swelling; chest pain, severe shortness of breath.  *  When you're admitted to hospital or undergo operation especially orthopedic surgery, you will need to be on prophylactic blood thinner.  *  Your daughters may consider refraining from birth control that contain estrogen; since they have 50% chance of carrying this mutation which can significantly increase the risk of blood clots on estrogen-containing birth control

## 2012-12-25 ENCOUNTER — Encounter: Payer: Self-pay | Admitting: Family Medicine

## 2012-12-25 ENCOUNTER — Ambulatory Visit (INDEPENDENT_AMBULATORY_CARE_PROVIDER_SITE_OTHER): Payer: BC Managed Care – PPO | Admitting: Family Medicine

## 2012-12-25 VITALS — BP 108/78 | HR 89 | Temp 98.2°F | Wt 186.0 lb

## 2012-12-25 DIAGNOSIS — F411 Generalized anxiety disorder: Secondary | ICD-10-CM

## 2012-12-25 DIAGNOSIS — R209 Unspecified disturbances of skin sensation: Secondary | ICD-10-CM

## 2012-12-25 DIAGNOSIS — R202 Paresthesia of skin: Secondary | ICD-10-CM

## 2012-12-25 DIAGNOSIS — F419 Anxiety disorder, unspecified: Secondary | ICD-10-CM

## 2012-12-25 MED ORDER — ALPRAZOLAM 0.5 MG PO TABS: 0.5000 mg | ORAL_TABLET | Freq: Two times a day (BID) | ORAL | Status: DC | PRN

## 2012-12-25 NOTE — Progress Notes (Signed)
  Subjective:    Patient ID: Jackson Smith, male    DOB: 04-20-1966, 47 y.o.   MRN: [id number]  HPI Here to ask about some poorly defined sensations in the right calf he has noticed for the past week or so. There is no pain or itching or burning or numbness. He describes this as a feeling like part of his leg is "separate from the rest and hanging loose". No swelling or redness. No recent trauma. Also he asks for refill son Xanax. 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 They have had some disagreements.    Review of Systems  Respiratory: Negative.   Cardiovascular: Negative.   Musculoskeletal: Negative.   Neurological: Negative.        Objective:   Physical Exam  Constitutional: He is oriented to person, place, and time. He appears well-developed and well-nourished.  Musculoskeletal:       The right calf is normal on exam. No edema or swelling. No masses or tenderness.   Neurological: He is alert and oriented to person, place, and time. He has normal reflexes. No cranial nerve deficit. He exhibits normal muscle tone. Coordination normal.  Psychiatric: He has a normal mood and affect. His behavior is normal. Thought content normal.          Assessment & Plan:  Refilled the Xanax. I am not sure how to characterize the symptoms he is having in the calf, other than this may represent some type of irritated nerve ending in the skin. He will observe this and follow up prn

## 2013-03-21 ENCOUNTER — Ambulatory Visit (INDEPENDENT_AMBULATORY_CARE_PROVIDER_SITE_OTHER): Payer: BC Managed Care – PPO | Admitting: Family Medicine

## 2013-03-21 ENCOUNTER — Encounter: Payer: Self-pay | Admitting: Family Medicine

## 2013-03-21 VITALS — BP 110/70 | HR 76 | Temp 98.2°F | Wt 187.0 lb

## 2013-03-21 DIAGNOSIS — M546 Pain in thoracic spine: Secondary | ICD-10-CM

## 2013-03-21 LAB — POCT URINALYSIS DIPSTICK
Bilirubin, UA: NEGATIVE
Glucose, UA: NEGATIVE
Ketones, UA: NEGATIVE
Leukocytes, UA: NEGATIVE
Nitrite, UA: NEGATIVE

## 2013-03-21 MED ORDER — DICLOFENAC SODIUM 75 MG PO TBEC: 75.0000 mg | DELAYED_RELEASE_TABLET | Freq: Two times a day (BID) | ORAL | Status: DC

## 2013-03-21 MED ORDER — CYCLOBENZAPRINE HCL 10 MG PO TABS: 10.0000 mg | ORAL_TABLET | Freq: Three times a day (TID) | ORAL | Status: DC | PRN

## 2013-03-21 NOTE — Progress Notes (Signed)
  Subjective:    Patient ID: Jackson Smith, male    DOB: 04/29/1966, 47 y.o.   MRN: [id number]  HPI Here for 2 days of tightness and pain in the middle back. No recent trauma. He has a hx of low back pain that flares up from time to time. Using heat.    Review of Systems  Constitutional: Negative.   Musculoskeletal: Positive for back pain.       Objective:   Physical Exam  Constitutional: He appears well-developed and well-nourished.  Cardiovascular: Normal rate, regular rhythm, normal heart sounds and intact distal pulses.   Pulmonary/Chest: Effort normal and breath sounds normal.  Musculoskeletal:  Tender along the middle back with some spasm. Full ROM           Assessment & Plan:  Use Diclofenac and Flexeril.

## 2013-03-21 NOTE — Addendum Note (Signed)
Addended by: Aniceto Boss A on: 03/21/2013 05:00 PM   Modules accepted: Orders

## 2013-07-02 ENCOUNTER — Ambulatory Visit (INDEPENDENT_AMBULATORY_CARE_PROVIDER_SITE_OTHER): Payer: BC Managed Care – PPO | Admitting: Internal Medicine

## 2013-07-02 ENCOUNTER — Encounter: Payer: Self-pay | Admitting: Internal Medicine

## 2013-07-02 VITALS — BP 110/70 | HR 83 | Temp 97.2°F

## 2013-07-02 DIAGNOSIS — M779 Enthesopathy, unspecified: Secondary | ICD-10-CM

## 2013-07-02 NOTE — Patient Instructions (Signed)
Tendinitis  Tendinitis is swelling and inflammation of the tendons. Tendons are band-like tissues that connect muscle to bone. Tendinitis commonly occurs in the:    Shoulders (rotator cuff).   Heels (Achilles tendon).   Elbows (triceps tendon).  CAUSES  Tendinitis is usually caused by overusing the tendon, muscles, and joints involved. When the tissue surrounding a tendon (synovium) becomes inflamed, it is called tenosynovitis. Tendinitis commonly develops in people whose jobs require repetitive motions.  SYMPTOMS   Pain.   Tenderness.   Mild swelling.  DIAGNOSIS  Tendinitis is usually diagnosed by physical exam. Your caregiver may also order X-rays or other imaging tests.  TREATMENT  Your caregiver may recommend certain medicines or exercises for your treatment.  HOME CARE INSTRUCTIONS    Use a sling or splint for as long as directed by your caregiver until the pain decreases.   Put ice on the injured area.   Put ice in a plastic bag.   Place a towel between your skin and the bag.   Leave the ice on for 15-20 minutes, 3-4 times a day.   Avoid using the limb while the tendon is painful. Perform gentle range of motion exercises only as directed by your caregiver. Stop exercises if pain or discomfort increase, unless directed otherwise by your caregiver.   Only take over-the-counter or prescription medicines for pain, discomfort, or fever as directed by your caregiver.  SEEK MEDICAL CARE IF:    Your pain and swelling increase.   You develop new, unexplained symptoms, especially increased numbness in the hands.  MAKE SURE YOU:    Understand these instructions.   Will watch your condition.   Will get help right away if you are not doing well or get worse.  Document Released: 12/01/2000 Document Revised: 02/26/2012 Document Reviewed: 02/20/2011  Exit[employer] Patient Information 2014 [employer]

## 2013-07-02 NOTE — Progress Notes (Signed)
Subjective:    Patient ID: Jackson Smith, male    DOB: 1966-05-02, 47 y.o.   MRN: [id number]  HPI  Pt presents to the clinic today with c/o a right swollen calf. This started about 1 month ago. The pain is at the top of his calf. He is not sure if there is swelling or not. He denies redness or warmth, or pain when he walks. He reports the pain as achy. He did take tylenol which did help with the pain.He denies any specific injury to the area. He was recently tested for Factor V Leiden as he recently found out it ran in his family. He did test positive but was not started on prophylactic blood thinner as he had no history of clot in the past.  Review of Systems      Past Medical History  Diagnosis Date  . Allergy     seasonal  . Anxiety     panic attack  . GERD (gastroesophageal reflux disease)   . Factor V Leiden mutation 11/30/2012  . Hemorrhoid     Current Outpatient Prescriptions  Medication Sig Dispense Refill  . ALPRAZolam (XANAX) 0.5 MG tablet Take 1 tablet (0.5 mg total) by mouth 2 (two) times daily as needed for anxiety.  60 tablet  5  . cyclobenzaprine (FLEXERIL) 10 MG tablet Take 1 tablet (10 mg total) by mouth 3 (three) times daily as needed for muscle spasms.  90 tablet  5  . diclofenac (VOLTAREN) 75 MG EC tablet Take 1 tablet (75 mg total) by mouth 2 (two) times daily.  60 tablet  5  . fexofenadine-pseudoephedrine (ALLEGRA-D 24) 180-240 MG per 24 hr tablet Take 1 tablet by mouth as needed.        No current facility-administered medications for this visit.    Allergies  Allergen Reactions  . Cashew Nut Oil   . Penicillins     Family History  Problem Relation Age of Onset  . Diabetes Father   . Heart attack Father   . Birth defects Brother   . Factor V Leiden deficiency Brother   . Factor V Leiden deficiency Mother   . Factor V Leiden deficiency Maternal Aunt   . Cancer Maternal Uncle 60    colon cancer    History   Social History  . Marital  Status: Single    Spouse Name: N/A    Number of Children: 0  . Years of Education: N/A   Occupational History  .  [employer]   Social History Main Topics  . Smoking status: Never Smoker   . Smokeless tobacco: Never Used  . Alcohol Use: No  . Drug Use: No  . Sexually Active: Not on file   Other Topics Concern  . Not on file   Social History Narrative  . No narrative on file     Constitutional: Denies fever, malaise, fatigue, headache or abrupt weight changes.  Musculoskeletal: Pt reports right calf pain. Denies decrease in range of motion, difficulty with gait, or joint pain and swelling.  Skin: Denies redness, rashes, lesions or ulcercations.     No other specific complaints in a complete review of systems (except as listed in HPI above).  Objective:   Physical Exam  BP 110/70  Pulse 83  Temp(Src) 97.2 F (36.2 C) (Oral)  SpO2 96% Wt Readings from Last 3 Encounters:  03/21/13 187 lb (84.823 kg)  12/25/12 186 lb (84.369 kg)  12/02/12 185 lb 9.6 oz (84.188  kg)    General: Appears his stated age, well developed, well nourished in NAD. Skin: Warm, dry and intact. No rashes, lesions or ulcerations noted. No erythema or warmth noticed..  Cardiovascular: Normal rate and rhythm. S1,S2 noted.  No murmur, rubs or gallops noted. No JVD or BLE edema. No carotid bruits noted. Pulmonary/Chest: Normal effort and positive vesicular breath sounds. No respiratory distress. No wheezes, rales or ronchi noted.  Musculoskeletal: Right calf 39.5 cm, left calf 38.75 cm. Normal range of motion. No signs of joint swelling. No difficulty with gait. Tenderness with palpation at the tendon at the top of the gastrocnemius.    BMET    Component Value Date/Time   NA 137 03/06/2012 0831   K 4.0 03/06/2012 0831   CL 102 03/06/2012 0831   CO2 25 03/06/2012 0831   GLUCOSE 89 03/06/2012 0831   BUN 15 03/06/2012 0831   CREATININE 0.8 03/06/2012 0831   CALCIUM 8.9 03/06/2012 0831   GFRNONAA  99 03/12/2008 0945   GFRAA 120 03/12/2008 0945    Lipid Panel     Component Value Date/Time   CHOL 195 03/06/2012 0831   TRIG 59.0 03/06/2012 0831   HDL 53.10 03/06/2012 0831   CHOLHDL 4 03/06/2012 0831   VLDL 11.8 03/06/2012 0831   LDLCALC 130* 03/06/2012 0831    CBC    Component Value Date/Time   WBC 5.9 12/02/2012 1457   WBC 3.5* 03/06/2012 0831   RBC 4.49 12/02/2012 1457   RBC 4.62 03/06/2012 0831   HGB 13.8 12/02/2012 1457   HGB 14.0 03/06/2012 0831   HCT 39.7 12/02/2012 1457   HCT 41.5 03/06/2012 0831   PLT 228 12/02/2012 1457   PLT 229.0 03/06/2012 0831   MCV 88.5 12/02/2012 1457   MCV 89.8 03/06/2012 0831   MCH 30.8 12/02/2012 1457   MCHC 34.8 12/02/2012 1457   MCHC 33.7 03/06/2012 0831   RDW 13.2 12/02/2012 1457   RDW 14.1 03/06/2012 0831   LYMPHSABS 1.7 12/02/2012 1457   LYMPHSABS 1.5 03/06/2012 0831   MONOABS 0.6 12/02/2012 1457   MONOABS 0.4 03/06/2012 0831   EOSABS 0.1 12/02/2012 1457   EOSABS 0.1 03/06/2012 0831   BASOSABS 0.0 12/02/2012 1457   BASOSABS 0.0 03/06/2012 0831    Hgb A1C No results found for this basename: HGBA1C         Assessment & Plan:   Tendonitis of right leg, new onset:  Take Ibuprofen OTC Low suspicion for a clot at this time Avoid repetitive motions  RTC as needed or if symptoms persist or worsen

## 2014-02-15 DIAGNOSIS — M7500 Adhesive capsulitis of unspecified shoulder: Secondary | ICD-10-CM

## 2014-02-15 HISTORY — DX: Adhesive capsulitis of unspecified shoulder: M75.00

## 2014-07-03 ENCOUNTER — Other Ambulatory Visit (INDEPENDENT_AMBULATORY_CARE_PROVIDER_SITE_OTHER): Payer: BC Managed Care – PPO

## 2014-07-03 DIAGNOSIS — Z0000 Encounter for general adult medical examination without abnormal findings: Secondary | ICD-10-CM

## 2014-07-03 LAB — HEPATIC FUNCTION PANEL
ALBUMIN: 4.2 g/dL (ref 3.5–5.2)
ALK PHOS: 66 U/L (ref 39–117)
ALT: 21 U/L (ref 0–53)
AST: 23 U/L (ref 0–37)
BILIRUBIN TOTAL: 0.5 mg/dL (ref 0.2–1.2)
Bilirubin, Direct: 0.1 mg/dL (ref 0.0–0.3)
Total Protein: 7.1 g/dL (ref 6.0–8.3)

## 2014-07-03 LAB — CBC WITH DIFFERENTIAL/PLATELET
BASOS ABS: 0 10*3/uL (ref 0.0–0.1)
Basophils Relative: 0.2 % (ref 0.0–3.0)
EOS ABS: 0.1 10*3/uL (ref 0.0–0.7)
Eosinophils Relative: 2.1 % (ref 0.0–5.0)
HCT: 41.1 % (ref 39.0–52.0)
Hemoglobin: 13.8 g/dL (ref 13.0–17.0)
LYMPHS PCT: 39.9 % (ref 12.0–46.0)
Lymphs Abs: 1.8 10*3/uL (ref 0.7–4.0)
MCHC: 33.7 g/dL (ref 30.0–36.0)
MCV: 90.1 fl (ref 78.0–100.0)
MONOS PCT: 9.3 % (ref 3.0–12.0)
Monocytes Absolute: 0.4 10*3/uL (ref 0.1–1.0)
NEUTROS PCT: 48.5 % (ref 43.0–77.0)
Neutro Abs: 2.2 10*3/uL (ref 1.4–7.7)
PLATELETS: 254 10*3/uL (ref 150.0–400.0)
RBC: 4.56 Mil/uL (ref 4.22–5.81)
RDW: 13.2 % (ref 11.5–15.5)
WBC: 4.4 10*3/uL (ref 4.0–10.5)

## 2014-07-03 LAB — BASIC METABOLIC PANEL
BUN: 12 mg/dL (ref 6–23)
CALCIUM: 8.9 mg/dL (ref 8.4–10.5)
CO2: 27 meq/L (ref 19–32)
CREATININE: 0.9 mg/dL (ref 0.4–1.5)
Chloride: 102 mEq/L (ref 96–112)
GFR: 93.46 mL/min (ref >60.00)
GLUCOSE: 77 mg/dL (ref 70–99)
Potassium: 4.7 mEq/L (ref 3.5–5.1)
SODIUM: 136 meq/L (ref 135–145)

## 2014-07-03 LAB — LIPID PANEL
Cholesterol: 180 mg/dL (ref 0–200)
HDL: 45.4 mg/dL
LDL Cholesterol: 125 mg/dL — ABNORMAL HIGH (ref 0–99)
NonHDL: 134.6
Total CHOL/HDL Ratio: 4
Triglycerides: 47 mg/dL (ref 0.0–149.0)
VLDL: 9.4 mg/dL (ref 0.0–40.0)

## 2014-07-03 LAB — POCT URINALYSIS DIPSTICK
Bilirubin, UA: NEGATIVE
Glucose, UA: NEGATIVE
Ketones, UA: NEGATIVE
Leukocytes, UA: NEGATIVE
Nitrite, UA: NEGATIVE
Protein, UA: NEGATIVE
Spec Grav, UA: 1.01
Urobilinogen, UA: 0.2
pH, UA: 6

## 2014-07-03 LAB — TSH: TSH: 1.91 u[IU]/mL (ref 0.35–4.50)

## 2014-07-10 ENCOUNTER — Encounter: Payer: Self-pay | Admitting: Family Medicine

## 2014-07-10 ENCOUNTER — Ambulatory Visit (INDEPENDENT_AMBULATORY_CARE_PROVIDER_SITE_OTHER): Payer: BC Managed Care – PPO | Admitting: Family Medicine

## 2014-07-10 VITALS — BP 98/61 | HR 66 | Temp 98.1°F | Ht 67.0 in | Wt 182.0 lb

## 2014-07-10 DIAGNOSIS — Z0000 Encounter for general adult medical examination without abnormal findings: Secondary | ICD-10-CM

## 2014-07-10 MED ORDER — ALPRAZOLAM 0.5 MG PO TABS: 0.5000 mg | ORAL_TABLET | Freq: Two times a day (BID) | ORAL | Status: DC | PRN

## 2014-07-10 NOTE — Progress Notes (Signed)
Pre visit review using our clinic review tool, if applicable. No additional management support is needed unless otherwise documented below in the visit note. 

## 2014-07-10 NOTE — Progress Notes (Signed)
   Subjective:    Patient ID: Jackson Smith, male    DOB: 04-Sep-1966, 48 y.o.   MRN: [id number]  HPI 48 yr old male for a cpx. He feels well.    Review of Systems  Constitutional: Negative.   HENT: Negative.   Eyes: Negative.   Respiratory: Negative.   Cardiovascular: Negative.   Gastrointestinal: Negative.   Genitourinary: Negative.   Musculoskeletal: Negative.   Skin: Negative.   Neurological: Negative.   Psychiatric/Behavioral: Negative.        Objective:   Physical Exam  Constitutional: He is oriented to person, place, and time. He appears well-developed and well-nourished. No distress.  HENT:  Head: Normocephalic and atraumatic.  Right Ear: External ear normal.  Left Ear: External ear normal.  Nose: Nose normal.  Mouth/Throat: Oropharynx is clear and moist. No oropharyngeal exudate.  Eyes: Conjunctivae and EOM are normal. Pupils are equal, round, and reactive to light. Right eye exhibits no discharge. Left eye exhibits no discharge. No scleral icterus.  Neck: Neck supple. No JVD present. No tracheal deviation present. No thyromegaly present.  Cardiovascular: Normal rate, regular rhythm, normal heart sounds and intact distal pulses.  Exam reveals no gallop and no friction rub.   No murmur heard. Pulmonary/Chest: Effort normal and breath sounds normal. No respiratory distress. He has no wheezes. He has no rales. He exhibits no tenderness.  Abdominal: Soft. Bowel sounds are normal. He exhibits no distension and no mass. There is no tenderness. There is no rebound and no guarding.  Genitourinary: Rectum normal, prostate normal and penis normal. Guaiac negative stool. No penile tenderness.  Musculoskeletal: Normal range of motion. He exhibits no edema and no tenderness.  Lymphadenopathy:    He has no cervical adenopathy.  Neurological: He is alert and oriented to person, place, and time. He has normal reflexes. No cranial nerve deficit. He exhibits normal muscle tone.  Coordination normal.  Skin: Skin is warm and dry. No rash noted. He is not diaphoretic. No erythema. No pallor.  Psychiatric: He has a normal mood and affect. His behavior is normal. Judgment and thought content normal.          Assessment & Plan:  Well exam.

## 2015-05-07 ENCOUNTER — Ambulatory Visit (INDEPENDENT_AMBULATORY_CARE_PROVIDER_SITE_OTHER): Payer: 59 | Admitting: Family Medicine

## 2015-05-07 ENCOUNTER — Encounter: Payer: Self-pay | Admitting: Family Medicine

## 2015-05-07 VITALS — BP 108/65 | HR 74 | Temp 98.0°F | Ht 67.0 in | Wt 182.0 lb

## 2015-05-07 DIAGNOSIS — K769 Liver disease, unspecified: Secondary | ICD-10-CM

## 2015-05-07 DIAGNOSIS — K7689 Other specified diseases of liver: Secondary | ICD-10-CM | POA: Diagnosis not present

## 2015-05-07 DIAGNOSIS — R109 Unspecified abdominal pain: Secondary | ICD-10-CM | POA: Diagnosis not present

## 2015-05-07 LAB — BASIC METABOLIC PANEL
BUN: 12 mg/dL (ref 6–23)
CO2: 32 mEq/L (ref 19–32)
Calcium: 9.3 mg/dL (ref 8.4–10.5)
Chloride: 102 mEq/L (ref 96–112)
Creatinine, Ser: 0.89 mg/dL (ref 0.40–1.50)
GFR: 96.76 mL/min (ref >60.00)
Glucose, Bld: 54 mg/dL — ABNORMAL LOW (ref 70–99)
Potassium: 3.9 mEq/L (ref 3.5–5.1)
SODIUM: 138 meq/L (ref 135–145)

## 2015-05-07 LAB — POCT URINALYSIS DIPSTICK
Bilirubin, UA: NEGATIVE
Glucose, UA: NEGATIVE
KETONES UA: NEGATIVE
LEUKOCYTES UA: NEGATIVE
Nitrite, UA: NEGATIVE
PH UA: 5.5
PROTEIN UA: NEGATIVE
Spec Grav, UA: <=1.005
UROBILINOGEN UA: 0.2

## 2015-05-07 LAB — HEPATIC FUNCTION PANEL
ALBUMIN: 4.4 g/dL (ref 3.5–5.2)
ALK PHOS: 69 U/L (ref 39–117)
ALT: 18 U/L (ref 0–53)
AST: 20 U/L (ref 0–37)
Bilirubin, Direct: 0.1 mg/dL (ref 0.0–0.3)
Total Bilirubin: 0.5 mg/dL (ref 0.2–1.2)
Total Protein: 7.3 g/dL (ref 6.0–8.3)

## 2015-05-07 LAB — CBC WITH DIFFERENTIAL/PLATELET
BASOS PCT: 0.7 % (ref 0.0–3.0)
Basophils Absolute: 0 10*3/uL (ref 0.0–0.1)
EOS ABS: 0.1 10*3/uL (ref 0.0–0.7)
Eosinophils Relative: 2.8 % (ref 0.0–5.0)
HEMATOCRIT: 40.6 % (ref 39.0–52.0)
HEMOGLOBIN: 13.8 g/dL (ref 13.0–17.0)
LYMPHS ABS: 1.6 10*3/uL (ref 0.7–4.0)
LYMPHS PCT: 40.2 % (ref 12.0–46.0)
MCHC: 34 g/dL (ref 30.0–36.0)
MCV: 88.3 fl (ref 78.0–100.0)
MONOS PCT: 10.9 % (ref 3.0–12.0)
Monocytes Absolute: 0.4 10*3/uL (ref 0.1–1.0)
NEUTROS ABS: 1.9 10*3/uL (ref 1.4–7.7)
Neutrophils Relative %: 45.4 % (ref 43.0–77.0)
Platelets: 238 10*3/uL (ref 150.0–400.0)
RBC: 4.6 Mil/uL (ref 4.22–5.81)
RDW: 13.1 % (ref 11.5–15.5)
WBC: 4.1 10*3/uL (ref 4.0–10.5)

## 2015-05-07 LAB — AMYLASE: AMYLASE: 58 U/L (ref 27–131)

## 2015-05-07 LAB — LIPASE: LIPASE: 21 U/L (ref 11.0–59.0)

## 2015-05-07 NOTE — Progress Notes (Signed)
   Subjective:    Patient ID: Jackson Smith, male    DOB: [birth year], 49 y.o.   MRN: [id number]  HPI Here for 10 days of intermittent sharp pains in the left flank along with nausea. No vomiting. No fever. No UTI symptoms and BMs are regular.    Review of Systems  Constitutional: Negative.   Respiratory: Negative.   Cardiovascular: Negative.   Gastrointestinal: Positive for nausea and abdominal pain. Negative for vomiting, diarrhea, constipation, blood in stool, anal bleeding and rectal pain.  Genitourinary: Positive for flank pain. Negative for dysuria, urgency, frequency, hematuria, enuresis, difficulty urinating and genital sores.       Objective:   Physical Exam  Constitutional: He appears well-developed and well-nourished.  Cardiovascular: Normal rate, regular rhythm, normal heart sounds and intact distal pulses.   Pulmonary/Chest: Effort normal and breath sounds normal.  Abdominal: Soft. Bowel sounds are normal. He exhibits no distension and no mass. There is no rebound and no guarding.  Mildly tender in the LUQ and left flank   Skin: No rash noted.          Assessment & Plan:  Flank pain. We will get labs today and set up a CT scan soon.

## 2015-05-07 NOTE — Progress Notes (Signed)
Pre visit review using our clinic review tool, if applicable. No additional management support is needed unless otherwise documented below in the visit note. 

## 2015-05-12 ENCOUNTER — Ambulatory Visit (INDEPENDENT_AMBULATORY_CARE_PROVIDER_SITE_OTHER)
Admission: RE | Admit: 2015-05-12 | Discharge: 2015-05-12 | Disposition: A | Discharge status: Home / Self Care | Payer: 59 | Source: Ambulatory Visit | Attending: Family Medicine | Admitting: Family Medicine

## 2015-05-12 DIAGNOSIS — R109 Unspecified abdominal pain: Secondary | ICD-10-CM

## 2015-05-12 MED ORDER — IOHEXOL 300 MG/ML  SOLN
100.0000 mL | Freq: Once | INTRAMUSCULAR | Status: AC | PRN
  Administered 2015-05-12: 100 mL via INTRAVENOUS

## 2015-05-13 NOTE — Addendum Note (Signed)
Addended by: Alysia Penna A on: 05/13/2015 08:01 AM   Modules accepted: Orders

## 2015-05-14 ENCOUNTER — Telehealth: Payer: Self-pay | Admitting: Family Medicine

## 2015-05-14 NOTE — Telephone Encounter (Signed)
Pt would like a call back as to why he is having a MRI .

## 2015-05-14 NOTE — Telephone Encounter (Signed)
I left voice message for pt to return my call, see result note.

## 2015-05-29 ENCOUNTER — Ambulatory Visit
Admission: RE | Admit: 2015-05-29 | Discharge: 2015-05-29 | Disposition: A | Discharge status: Home / Self Care | Payer: 59 | Source: Ambulatory Visit | Attending: Family Medicine | Admitting: Family Medicine

## 2015-05-29 DIAGNOSIS — K769 Liver disease, unspecified: Secondary | ICD-10-CM

## 2015-05-29 MED ORDER — GADOBENATE DIMEGLUMINE 529 MG/ML IV SOLN
17.0000 mL | Freq: Once | INTRAVENOUS | Status: AC | PRN
  Administered 2015-05-29: 17 mL via INTRAVENOUS

## 2016-03-24 IMAGING — CT CT ABD-PELV W/ CM
2 of 5 series · 16 of 46 positions shown, 18 images · IV contrast (Omnipaque 300)
Comparison: 06/11/2009.

CLINICAL DATA: Initial encounter for intermittent left-sided
abdominal pain with nausea for 1.5 weeks.

EXAM:
CT ABDOMEN AND PELVIS WITH CONTRAST
TECHNIQUE: Multidetector CT imaging of the abdomen and pelvis was performed
using the standard protocol following bolus administration of
intravenous contrast.
CONTRAST:  100mL OMNIPAQUE IOHEXOL 300 MG/ML  SOLN

[Series 2: abd/ pel 5mm · axial · 0.70mm/px · z∈[-478,-28]mm · 13 of 100 slices shown, 15 images]
[im 5/100  soft-tissue]
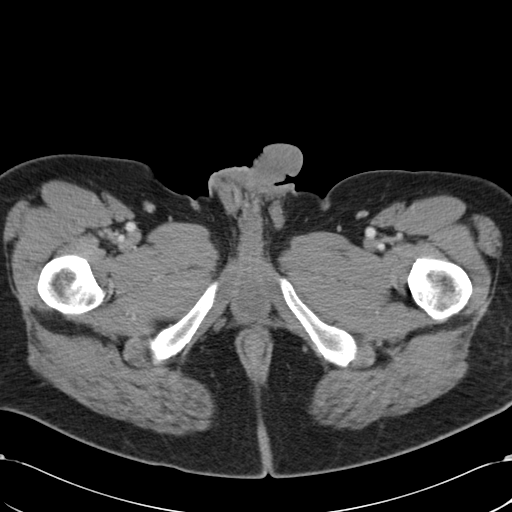
[im 5/100  bone]
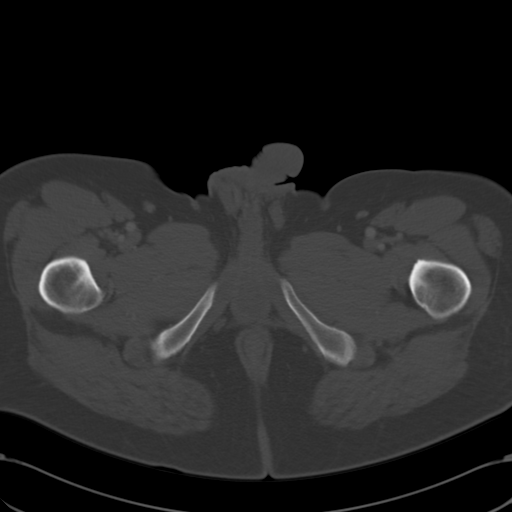
[im 15/100  soft-tissue]
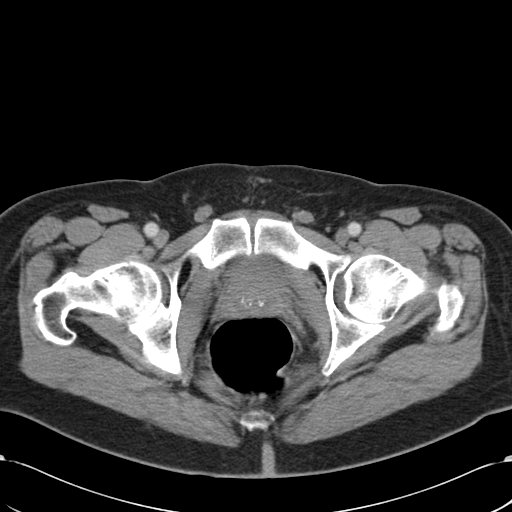
[im 20/100  soft-tissue]
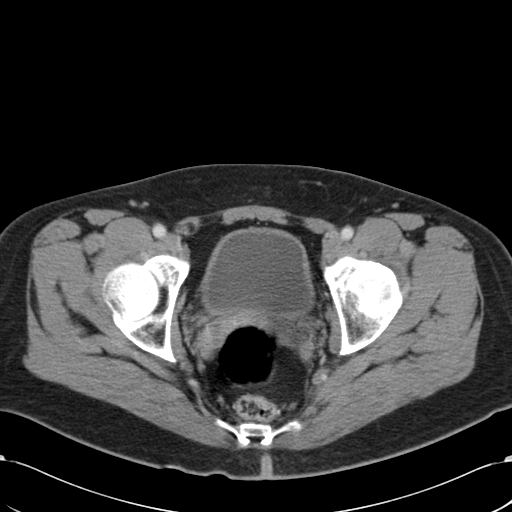
[im 30/100  soft-tissue]
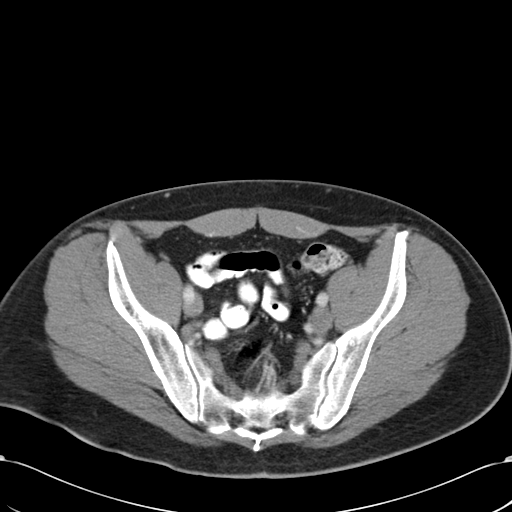
[im 35/100  soft-tissue]
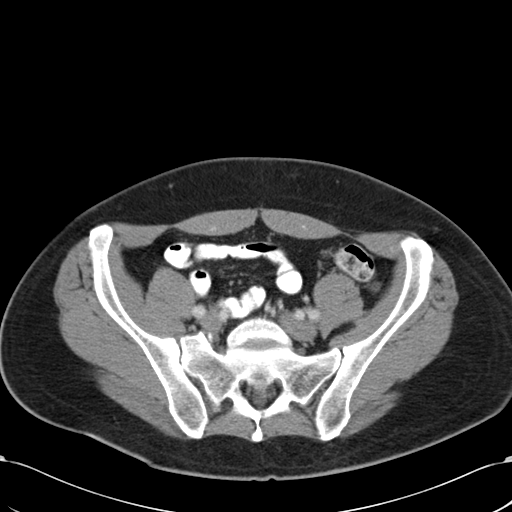
[im 45/100  soft-tissue]
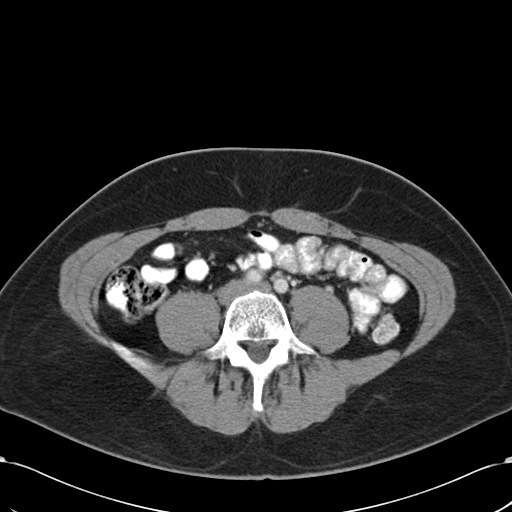
[im 50/100  soft-tissue]
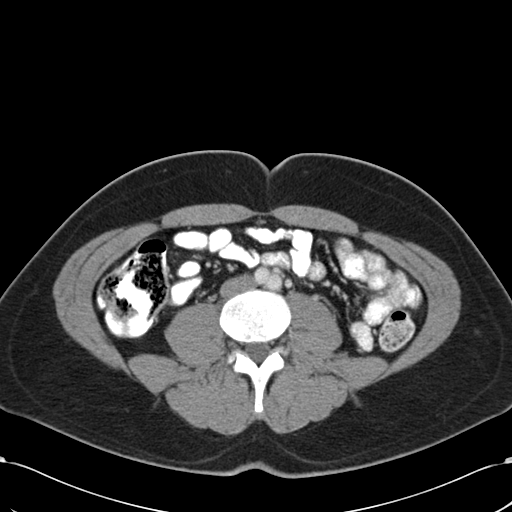
[im 55/100  soft-tissue]
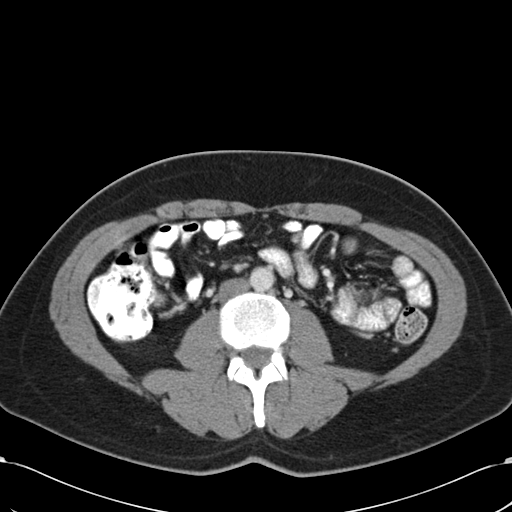
[im 65/100  soft-tissue]
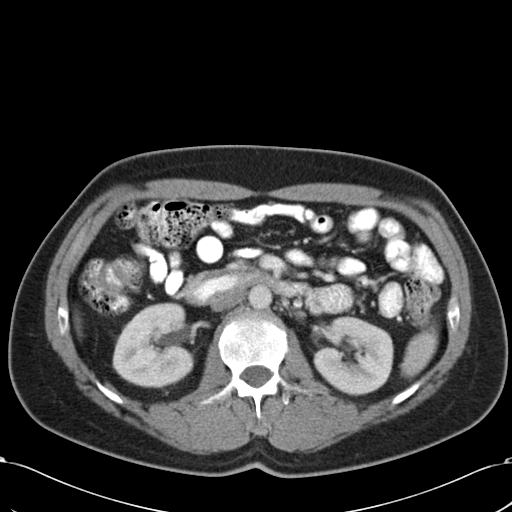
[im 65/100  bone]
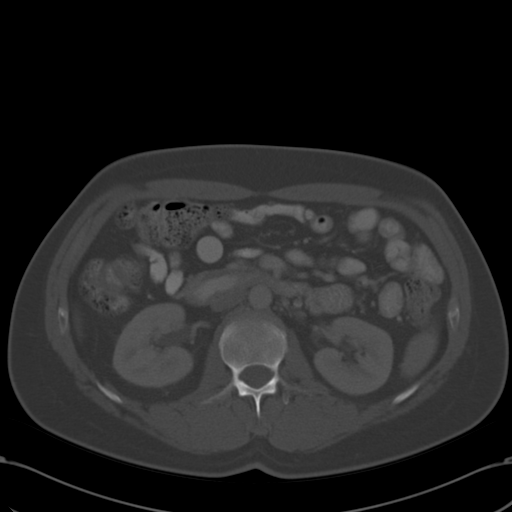
[im 70/100  soft-tissue]
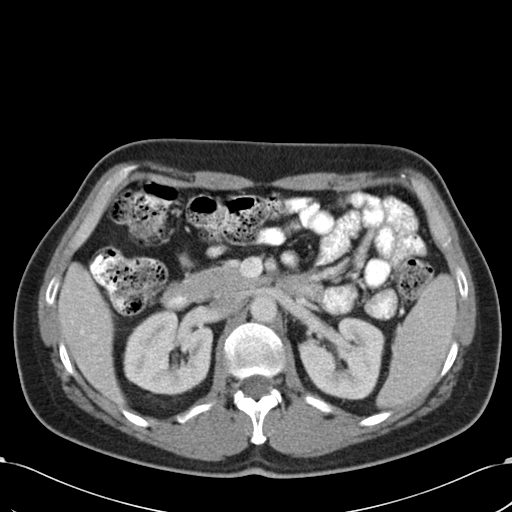
[im 80/100  soft-tissue]
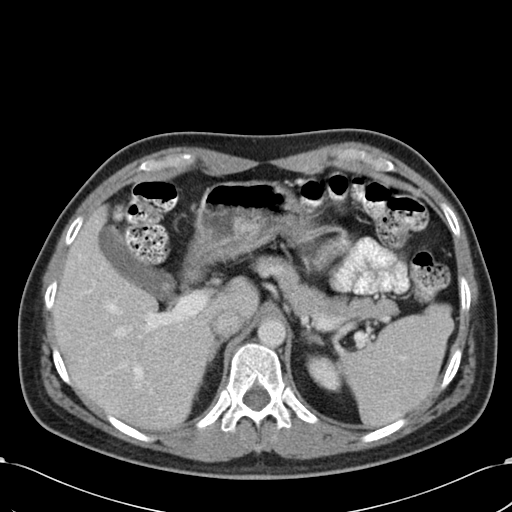
[im 85/100  soft-tissue]
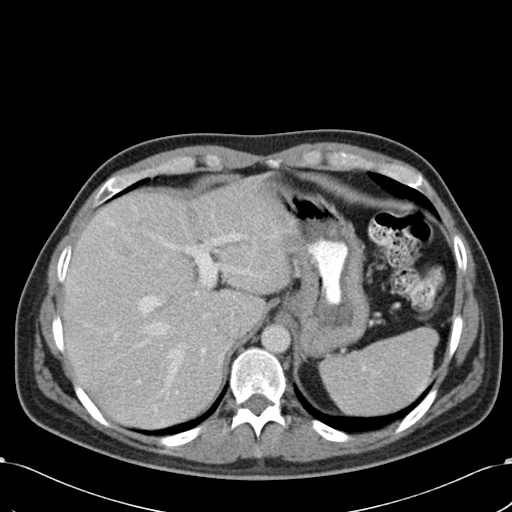
[im 95/100  soft-tissue]
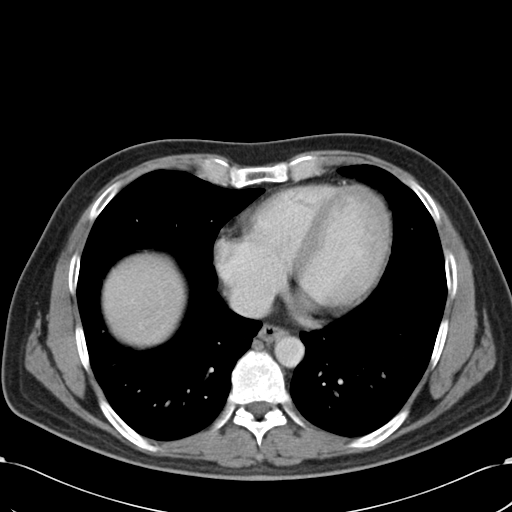

[Series 602: cor · coronal · 1.01mm/px · 3 of 107 slices shown]
[im 36/107  soft-tissue]
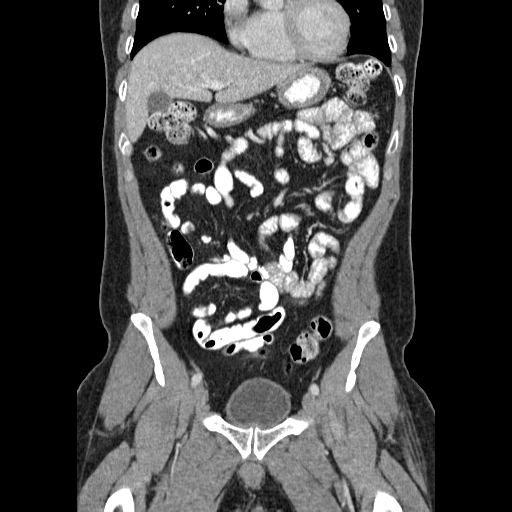
[im 48/107  soft-tissue]
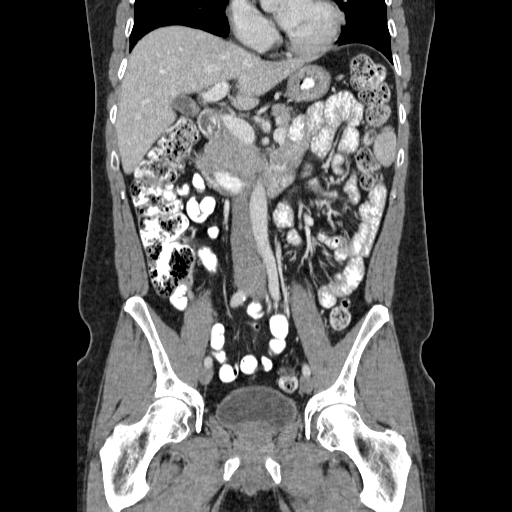
[im 59/107  soft-tissue]
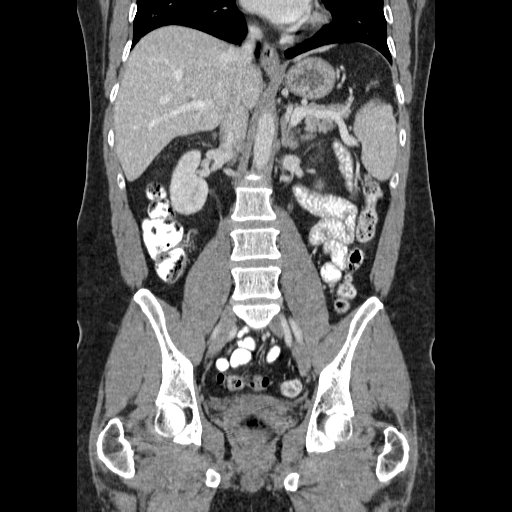

[16 of 46 positions shown; findings below may reference images not displayed]

FINDINGS: Lower chest:  Unremarkable.

Hepatobiliary: 1.7 x 1.3 cm low-density lesion in the posterior
hepatic dome shows internal enhancement. This lesion was present
previously but measured only about 5 mm at that time. A second
low-density lesion seen in the liver towards the gallbladder fundus
measures 6 mm, also progressed. Small stones are seen in the
gallbladder. No intrahepatic or extrahepatic biliary dilation.

Pancreas: No focal mass lesion. No dilatation of the main duct. No
intraparenchymal cyst. No peripancreatic edema.

Spleen: No splenomegaly. No focal mass lesion.

Adrenals/Urinary Tract: No adrenal nodule or mass. Kidneys are
unremarkable. No hydroureter. Bladder has normal CT imaging
features.

Stomach/Bowel: Stomach is nondistended. No gastric wall thickening.
No evidence of outlet obstruction. Duodenum is normally positioned
as is the ligament of Treitz. No small bowel wall thickening. No
small bowel dilatation. Terminal ileum is normal. Appendix is
normal. No gross colonic mass. No colonic wall thickening. No
substantial diverticular change.

Vascular/Lymphatic: No abdominal aortic aneurysm. No abdominal
lymphadenopathy. No evidence for pelvic sidewall lymphadenopathy.

Reproductive: Prostate gland and seminal vesicles are unremarkable.

Other: No intraperitoneal free fluid.

Musculoskeletal: Bone windows reveal no worrisome lytic or sclerotic
osseous lesions.
IMPRESSION: 1. Interval progression of a low-density lesion in the posterior
right hepatic dome, demonstrating internal enhancement. This is
likely a septated cyst, but given the interval growth and internal
enhancement, MRI without and with contrast is recommended to
confirm. The tiny inferior right hepatic lesion could be reassessed
at that time as well.
2. Cholelithiasis.

## 2016-08-11 ENCOUNTER — Encounter: Payer: Self-pay | Admitting: Adult Health

## 2016-08-11 ENCOUNTER — Ambulatory Visit (INDEPENDENT_AMBULATORY_CARE_PROVIDER_SITE_OTHER): Payer: 59 | Admitting: Adult Health

## 2016-08-11 VITALS — BP 110/62 | Temp 98.6°F | Ht 67.0 in | Wt 179.8 lb

## 2016-08-11 DIAGNOSIS — J069 Acute upper respiratory infection, unspecified: Secondary | ICD-10-CM | POA: Diagnosis not present

## 2016-08-11 NOTE — Progress Notes (Signed)
Subjective:    Patient ID: Jackson Smith, male    DOB: 07/04/1966, 50 y.o.   MRN: [id number]  HPI  50 year old male who presents to the office today for 3-4 days of chest congestion and intermittent dry cough. Patient reports " feels like something is in my throat.  He denies fevers, or feeling acutely ill. He does not feel SOB. Denies any sinusitis type symptoms.   He has not tried anything over the counter.   Review of Systems  Constitutional: Negative.   HENT: Positive for sore throat. Negative for congestion, postnasal drip, rhinorrhea, sinus pressure, trouble swallowing and voice change.   Respiratory: Positive for cough and chest tightness. Negative for apnea, choking, shortness of breath, wheezing and stridor.   Cardiovascular: Negative.   Skin: Negative.   Neurological: Negative.   Psychiatric/Behavioral: Negative for sleep disturbance.  All other systems reviewed and are negative.  Past Medical History:  Diagnosis Date  . Allergy    seasonal  . Anxiety    panic attack  . Factor V Leiden mutation ([place]) 11/30/2012  . Frozen shoulder March 2015   right side, saw Dr. Fredna Dow, resolved with PT   . GERD (gastroesophageal reflux disease)   . Hemorrhoid     Social History   Social History  . Marital status: Single    Spouse name: N/A  . Number of children: 0  . Years of education: N/A   Occupational History  .  [employer]   Social History Main Topics  . Smoking status: Never Smoker  . Smokeless tobacco: Never Used  . Alcohol use No  . Drug use: No  . Sexual activity: Not on file   Other Topics Concern  . Not on file   Social History Narrative  . No narrative on file    Past Surgical History:  Procedure Laterality Date  . TESTICLE SURGERY  2010   decreased blood flow      Family History  Problem Relation Age of Onset  . Diabetes Father   . Heart attack Father   . Birth defects Brother   . Factor V Leiden deficiency Brother   . Factor  V Leiden deficiency Mother   . Factor V Leiden deficiency Maternal Aunt   . Cancer Maternal Uncle 60    colon cancer    Allergies  Allergen Reactions  . Cashew Nut Oil   . Penicillins     Current Outpatient Prescriptions on File Prior to Visit  Medication Sig Dispense Refill  . ALPRAZolam (XANAX) 0.5 MG tablet Take 1 tablet (0.5 mg total) by mouth 2 (two) times daily as needed for anxiety. 60 tablet 5  . cyclobenzaprine (FLEXERIL) 10 MG tablet Take 1 tablet (10 mg total) by mouth 3 (three) times daily as needed for muscle spasms. 90 tablet 5  . diclofenac (VOLTAREN) 75 MG EC tablet Take 1 tablet (75 mg total) by mouth 2 (two) times daily. 60 tablet 5  . fexofenadine-pseudoephedrine (ALLEGRA-D 24) 180-240 MG per 24 hr tablet Take 1 tablet by mouth as needed.      No current facility-administered medications on file prior to visit.     BP 110/62   Temp 98.6 F (37 C) (Oral)   Ht 5\' 7"  (1.702 m)   Wt 179 lb 12.8 oz (81.6 kg)   BMI 28.16 kg/m       Objective:   Physical Exam  Constitutional: He appears well-developed and well-nourished. No distress.  HENT:  Head: Normocephalic and atraumatic.  Right Ear: Hearing, tympanic membrane, external ear and ear canal normal.  Left Ear: Hearing, tympanic membrane, external ear and ear canal normal.  Nose: Nose normal.  Mouth/Throat: Oropharynx is clear and moist. No oropharyngeal exudate.  Eyes: Conjunctivae and EOM are normal. Pupils are equal, round, and reactive to light. Right eye exhibits no discharge. Left eye exhibits no discharge. No scleral icterus.  Neck: Normal range of motion. Neck supple. No thyromegaly present.  Cardiovascular: Normal rate, regular rhythm, normal heart sounds and intact distal pulses.  Exam reveals no gallop and no friction rub.   No murmur heard. Pulmonary/Chest: Effort normal and breath sounds normal. No respiratory distress. He has no decreased breath sounds. He has no wheezes. He has no rhonchi. He  has no rales. He exhibits no tenderness.  Lymphadenopathy:    He has no cervical adenopathy.  Skin: Skin is warm and dry. No rash noted. No erythema. No pallor.  Psychiatric: He has a normal mood and affect. His behavior is normal. Judgment and thought content normal.  Nursing note and vitals reviewed.     Assessment & Plan:  1. URI (upper respiratory infection) - No signs of infection noted.  - Likely viral - OTC mucinex and humidifier  - Follow up if no improvement  Dorothyann Peng, NP

## 2016-10-12 ENCOUNTER — Other Ambulatory Visit (INDEPENDENT_AMBULATORY_CARE_PROVIDER_SITE_OTHER): Payer: 59

## 2016-10-12 DIAGNOSIS — Z0000 Encounter for general adult medical examination without abnormal findings: Secondary | ICD-10-CM

## 2016-10-12 LAB — LIPID PANEL
Cholesterol: 178 mg/dL (ref 0–200)
HDL: 44.5 mg/dL (ref >39.00)
LDL Cholesterol: 120 mg/dL — ABNORMAL HIGH (ref 0–99)
NONHDL: 133.79
Total CHOL/HDL Ratio: 4
Triglycerides: 67 mg/dL (ref 0.0–149.0)
VLDL: 13.4 mg/dL (ref 0.0–40.0)

## 2016-10-12 LAB — POC URINALSYSI DIPSTICK (AUTOMATED)
BILIRUBIN UA: NEGATIVE
GLUCOSE UA: NEGATIVE
Ketones, UA: NEGATIVE
Leukocytes, UA: NEGATIVE
Nitrite, UA: NEGATIVE
PH UA: 5.5
Protein, UA: NEGATIVE
SPEC GRAV UA: 1.02
Urobilinogen, UA: 0.2

## 2016-10-12 LAB — HEPATIC FUNCTION PANEL
ALK PHOS: 67 U/L (ref 39–117)
ALT: 16 U/L (ref 0–53)
AST: 18 U/L (ref 0–37)
Albumin: 4.1 g/dL (ref 3.5–5.2)
BILIRUBIN DIRECT: 0.1 mg/dL (ref 0.0–0.3)
Total Bilirubin: 0.5 mg/dL (ref 0.2–1.2)
Total Protein: 6.7 g/dL (ref 6.0–8.3)

## 2016-10-12 LAB — BASIC METABOLIC PANEL
BUN: 14 mg/dL (ref 6–23)
CALCIUM: 9 mg/dL (ref 8.4–10.5)
CO2: 27 mEq/L (ref 19–32)
Chloride: 97 mEq/L (ref 96–112)
Creatinine, Ser: 1.02 mg/dL (ref 0.40–1.50)
GFR: 82.18 mL/min (ref >60.00)
Glucose, Bld: 129 mg/dL — ABNORMAL HIGH (ref 70–99)
Potassium: 4.3 mEq/L (ref 3.5–5.1)
Sodium: 131 mEq/L — ABNORMAL LOW (ref 135–145)

## 2016-10-12 LAB — CBC WITH DIFFERENTIAL/PLATELET
Basophils Absolute: 0 10*3/uL (ref 0.0–0.1)
Basophils Relative: 0.6 % (ref 0.0–3.0)
EOS ABS: 0.1 10*3/uL (ref 0.0–0.7)
Eosinophils Relative: 2.2 % (ref 0.0–5.0)
HCT: 39.1 % (ref 39.0–52.0)
Hemoglobin: 13.4 g/dL (ref 13.0–17.0)
Lymphocytes Relative: 34 % (ref 12.0–46.0)
Lymphs Abs: 1.7 10*3/uL (ref 0.7–4.0)
MCHC: 34.3 g/dL (ref 30.0–36.0)
MCV: 88.1 fl (ref 78.0–100.0)
Monocytes Absolute: 0.5 10*3/uL (ref 0.1–1.0)
Monocytes Relative: 10 % (ref 3.0–12.0)
NEUTROS ABS: 2.6 10*3/uL (ref 1.4–7.7)
NEUTROS PCT: 53.2 % (ref 43.0–77.0)
PLATELETS: 230 10*3/uL (ref 150.0–400.0)
RBC: 4.43 Mil/uL (ref 4.22–5.81)
RDW: 13.5 % (ref 11.5–15.5)
WBC: 4.9 10*3/uL (ref 4.0–10.5)

## 2016-10-12 LAB — TSH: TSH: 1.87 u[IU]/mL (ref 0.35–4.50)

## 2016-10-23 ENCOUNTER — Encounter: Payer: Self-pay | Admitting: Gastroenterology

## 2016-10-23 ENCOUNTER — Ambulatory Visit (INDEPENDENT_AMBULATORY_CARE_PROVIDER_SITE_OTHER): Payer: 59 | Admitting: Family Medicine

## 2016-10-23 ENCOUNTER — Encounter: Payer: Self-pay | Admitting: Family Medicine

## 2016-10-23 VITALS — BP 106/69 | HR 60 | Temp 98.3°F | Ht 67.0 in | Wt 181.0 lb

## 2016-10-23 DIAGNOSIS — Z0000 Encounter for general adult medical examination without abnormal findings: Secondary | ICD-10-CM | POA: Diagnosis not present

## 2016-10-23 DIAGNOSIS — R739 Hyperglycemia, unspecified: Secondary | ICD-10-CM | POA: Diagnosis not present

## 2016-10-23 MED ORDER — ALPRAZOLAM 0.5 MG PO TABS: 0.5000 mg | ORAL_TABLET | Freq: Two times a day (BID) | ORAL | 5 refills | Status: DC | PRN

## 2016-10-23 NOTE — Progress Notes (Signed)
Pre visit review using our clinic review tool, if applicable. No additional management support is needed unless otherwise documented below in the visit note. 

## 2016-10-23 NOTE — Progress Notes (Signed)
   Subjective:    Patient ID: Jackson Smith, male    DOB: [birth year], 50 y.o.   MRN: [id number]  HPI 50 yr old male for a well exam. He feels fine. We went over his lab results and we discussed the glucose at 129. He notes his father was a diabetic who developed heart and kidney problems prior to his death.    Review of Systems  Constitutional: Negative.   HENT: Negative.   Eyes: Negative.   Respiratory: Negative.   Cardiovascular: Negative.   Gastrointestinal: Negative.   Genitourinary: Negative.   Musculoskeletal: Negative.   Skin: Negative.   Neurological: Negative.   Psychiatric/Behavioral: Negative.        Objective:   Physical Exam  Constitutional: He is oriented to person, place, and time. He appears well-developed and well-nourished. No distress.  HENT:  Head: Normocephalic and atraumatic.  Right Ear: External ear normal.  Left Ear: External ear normal.  Nose: Nose normal.  Mouth/Throat: Oropharynx is clear and moist. No oropharyngeal exudate.  Eyes: Conjunctivae and EOM are normal. Pupils are equal, round, and reactive to light. Right eye exhibits no discharge. Left eye exhibits no discharge. No scleral icterus.  Neck: Neck supple. No JVD present. No tracheal deviation present. No thyromegaly present.  Cardiovascular: Normal rate, regular rhythm, normal heart sounds and intact distal pulses.  Exam reveals no gallop and no friction rub.   No murmur heard. Pulmonary/Chest: Effort normal and breath sounds normal. No respiratory distress. He has no wheezes. He has no rales. He exhibits no tenderness.  Abdominal: Soft. Bowel sounds are normal. He exhibits no distension and no mass. There is no tenderness. There is no rebound and no guarding.  Genitourinary: Penis normal. No penile tenderness.  Musculoskeletal: Normal range of motion. He exhibits no edema or tenderness.  Lymphadenopathy:    He has no cervical adenopathy.  Neurological: He is alert and oriented to  person, place, and time. He has normal reflexes. No cranial nerve deficit. He exhibits normal muscle tone. Coordination normal.  Skin: Skin is warm and dry. No rash noted. He is not diaphoretic. No erythema. No pallor.  Psychiatric: He has a normal mood and affect. His behavior is normal. Judgment and thought content normal.          Assessment & Plan:  Well exam. We will get an A1c today to address his elevated glucose. We discussed diet and exercise advice. Set up a colonoscopy soon.  Laurey Morale, MD

## 2016-10-24 LAB — HEMOGLOBIN A1C: HEMOGLOBIN A1C: 5.4 % (ref 4.6–6.5)

## 2016-11-27 ENCOUNTER — Ambulatory Visit (AMBULATORY_SURGERY_CENTER): Payer: Self-pay

## 2016-11-27 VITALS — Ht 68.25 in | Wt 177.0 lb

## 2016-11-27 DIAGNOSIS — Z800 Family history of malignant neoplasm of digestive organs: Secondary | ICD-10-CM

## 2016-11-27 MED ORDER — SUPREP BOWEL PREP KIT 17.5-3.13-1.6 GM/177ML PO SOLN: 1.0000 | Freq: Once | ORAL | 0 refills | Status: AC

## 2016-11-27 NOTE — Progress Notes (Signed)
No allergies to eggs or soy No past problems with anesthesia No diet meds No home oyxgen  Registered for emmi

## 2016-11-28 ENCOUNTER — Encounter: Payer: Self-pay | Admitting: Gastroenterology

## 2016-11-28 ENCOUNTER — Encounter: Payer: Self-pay | Admitting: Family Medicine

## 2016-11-28 ENCOUNTER — Ambulatory Visit (INDEPENDENT_AMBULATORY_CARE_PROVIDER_SITE_OTHER): Payer: 59 | Admitting: Family Medicine

## 2016-11-28 VITALS — BP 98/65 | HR 71 | Temp 98.3°F | Ht 68.25 in | Wt 178.0 lb

## 2016-11-28 DIAGNOSIS — J069 Acute upper respiratory infection, unspecified: Secondary | ICD-10-CM

## 2016-11-28 DIAGNOSIS — B9789 Other viral agents as the cause of diseases classified elsewhere: Secondary | ICD-10-CM | POA: Diagnosis not present

## 2016-11-28 NOTE — Progress Notes (Signed)
Pre visit review using our clinic review tool, if applicable. No additional management support is needed unless otherwise documented below in the visit note. 

## 2016-11-28 NOTE — Progress Notes (Signed)
   Subjective:    Patient ID: Jackson Smith, male    DOB: [birth year], 50 y.o.   MRN: [id number]  HPI Here for 5 days of stuffy head, PND, and a dry cough. No fever.    Review of Systems  Constitutional: Negative.   HENT: Positive for congestion and postnasal drip. Negative for sinus pain and sore throat.   Eyes: Negative.   Respiratory: Positive for cough.        Objective:   Physical Exam  Constitutional: He appears well-developed and well-nourished.  HENT:  Right Ear: External ear normal.  Left Ear: External ear normal.  Nose: Nose normal.  Mouth/Throat: Oropharynx is clear and moist.  Eyes: Conjunctivae are normal.  Neck: No thyromegaly present.  Pulmonary/Chest: Effort normal and breath sounds normal.  Lymphadenopathy:    He has no cervical adenopathy.          Assessment & Plan:  Viral URI. Drink fluids, use Mucinex prn.  Laurey Morale, MD

## 2016-12-08 ENCOUNTER — Ambulatory Visit (AMBULATORY_SURGERY_CENTER): Payer: 59 | Admitting: Gastroenterology

## 2016-12-08 ENCOUNTER — Encounter: Payer: Self-pay | Admitting: Gastroenterology

## 2016-12-08 VITALS — BP 110/65 | HR 82 | Temp 99.3°F | Resp 13 | Ht 68.0 in | Wt 177.0 lb

## 2016-12-08 DIAGNOSIS — D122 Benign neoplasm of ascending colon: Secondary | ICD-10-CM

## 2016-12-08 DIAGNOSIS — Z1212 Encounter for screening for malignant neoplasm of rectum: Secondary | ICD-10-CM | POA: Diagnosis not present

## 2016-12-08 DIAGNOSIS — K635 Polyp of colon: Secondary | ICD-10-CM

## 2016-12-08 DIAGNOSIS — Z800 Family history of malignant neoplasm of digestive organs: Secondary | ICD-10-CM

## 2016-12-08 DIAGNOSIS — Z1211 Encounter for screening for malignant neoplasm of colon: Secondary | ICD-10-CM | POA: Diagnosis present

## 2016-12-08 HISTORY — PX: COLONOSCOPY: SHX174

## 2016-12-08 MED ORDER — SODIUM CHLORIDE 0.9 % IV SOLN: 500.0000 mL | INTRAVENOUS | Status: DC

## 2016-12-08 NOTE — Progress Notes (Signed)
A/ox3 pleased with MAC, report to Michelle RN 

## 2016-12-08 NOTE — Op Note (Signed)
[facility] Patient Name: Jackson Smith Procedure Date: 12/08/2016 3:02 PM MRN: [id number] Endoscopist: Remo Lipps P. Jaegar Croft MD, MD Age: 50 Referring MD:  Date of Birth: [birth year] Gender: Male Account #: [id number] Procedure:                Colonoscopy Indications:              Screening for malignant neoplasm in the colon, This                            is the patient's first colonoscopy Medicines:                Monitored Anesthesia Care Procedure:                Pre-Anesthesia Assessment:                           - Prior to the procedure, a History and Physical                            was performed, and patient medications and                            allergies were reviewed. The patient's tolerance of                            previous anesthesia was also reviewed. The risks                            and benefits of the procedure and the sedation                            options and risks were discussed with the patient.                            All questions were answered, and informed consent                            was obtained. Prior Anticoagulants: The patient has                            taken no previous anticoagulant or antiplatelet                            agents. ASA Grade Assessment: II - A patient with                            mild systemic disease. After reviewing the risks                            and benefits, the patient was deemed in                            satisfactory condition to undergo the procedure.  After obtaining informed consent, the colonoscope                            was passed under direct vision. Throughout the                            procedure, the patient's blood pressure, pulse, and                            oxygen saturations were monitored continuously. The                            Model CF-HQ190L [id number]) scope was introduced                            through the  anus and advanced to the the cecum,                            identified by appendiceal orifice and ileocecal                            valve. The colonoscopy was performed without                            difficulty. The patient tolerated the procedure                            well. The quality of the bowel preparation was                            good. The ileocecal valve, appendiceal orifice, and                            rectum were photographed. Scope In: 3:06:26 PM Scope Out: 3:22:19 PM Scope Withdrawal Time: 0 hours 10 minutes 17 seconds  Total Procedure Duration: 0 hours 15 minutes 53 seconds  Findings:                 The perianal and digital rectal examinations were                            normal.                           Two sessile polyps were found in the ascending                            colon. The polyps were diminutive in size. These                            polyps were removed with a cold biopsy forceps.                            Resection and retrieval were complete.  The exam was otherwise without abnormality on                            direct and retroflexion views. Complications:            No immediate complications. Estimated blood loss:                            Minimal. Estimated Blood Loss:     Estimated blood loss was minimal. Impression:               - Two diminutive polyps in the ascending colon,                            removed with a cold biopsy forceps. Resected and                            retrieved.                           - The examination was otherwise normal on direct                            and retroflexion views. Recommendation:           - Patient has a contact number available for                            emergencies. The signs and symptoms of potential                            delayed complications were discussed with the                            patient. Return to normal activities  tomorrow.                            Written discharge instructions were provided to the                            patient.                           - Resume previous diet.                           - Continue present medications.                           - Await pathology results.                           - Repeat colonoscopy is recommended for                            surveillance. The colonoscopy date will be  determined after pathology results from today's                            exam become available for review. Remo Lipps P. Daily Crate MD, MD 12/08/2016 3:25:43 PM This report has been signed electronically.

## 2016-12-08 NOTE — Progress Notes (Signed)
Called to room to assist during endoscopic procedure.  Patient ID and intended procedure confirmed with present staff. Received instructions for my participation in the procedure from the performing physician.  

## 2016-12-08 NOTE — Patient Instructions (Signed)
YOU HAD AN ENDOSCOPIC PROCEDURE TODAY AT [facility]:   Refer to the procedure report that was given to you for any specific questions about what was found during the examination.  If the procedure report does not answer your questions, please call your gastroenterologist to clarify.  If you requested that your care partner not be given the details of your procedure findings, then the procedure report has been included in a sealed envelope for you to review at your convenience later.  YOU SHOULD EXPECT: Some feelings of bloating in the abdomen. Passage of more gas than usual.  Walking can help get rid of the air that was put into your GI tract during the procedure and reduce the bloating. If you had a lower endoscopy (such as a colonoscopy or flexible sigmoidoscopy) you may notice spotting of blood in your stool or on the toilet paper. If you underwent a bowel prep for your procedure, you may not have a normal bowel movement for a few days.  Please Note:  You might notice some irritation and congestion in your nose or some drainage.  This is from the oxygen used during your procedure.  There is no need for concern and it should clear up in a day or so.  SYMPTOMS TO REPORT IMMEDIATELY:   Following lower endoscopy (colonoscopy or flexible sigmoidoscopy):  Excessive amounts of blood in the stool  Significant tenderness or worsening of abdominal pains  Swelling of the abdomen that is new, acute  Fever of 100F or higher  For urgent or emergent issues, a gastroenterologist can be reached at any hour by calling [phone].   DIET:  We do recommend a small meal at first, but then you may proceed to your regular diet.  Drink plenty of fluids but you should avoid alcoholic beverages for 24 hours.  ACTIVITY:  You should plan to take it easy for the rest of today and you should NOT DRIVE or use heavy machinery until tomorrow (because of the sedation medicines used during the test).     FOLLOW UP: Our staff will call the number listed on your records the next business day following your procedure to check on you and address any questions or concerns that you may have regarding the information given to you following your procedure. If we do not reach you, we will leave a message.  However, if you are feeling well and you are not experiencing any problems, there is no need to return our call.  We will assume that you have returned to your regular daily activities without incident.  If any biopsies were taken you will be contacted by phone or by letter within the next 1-3 weeks.  Please call us at [phone] if you have not heard about the biopsies in 3 weeks.    SIGNATURES/CONFIDENTIALITY: You and/or your care partner have signed paperwork which will be entered into your electronic medical record.  These signatures attest to the fact that that the information above on your After Visit Summary has been reviewed and is understood.  Full responsibility of the confidentiality of this discharge information lies with you and/or your care-partner.  Polyps (handout given) Repeat Colonoscopy depends on results of Biopsy

## 2016-12-12 ENCOUNTER — Telehealth: Payer: Self-pay | Admitting: *Deleted

## 2016-12-12 NOTE — Telephone Encounter (Signed)
  Follow up Call-  Call back number 12/08/2016  Post procedure Call Back phone  # [phone]  Permission to leave phone message Yes  Some recent data might be hidden    [facility]

## 2016-12-19 ENCOUNTER — Encounter: Payer: Self-pay | Admitting: Gastroenterology

## 2017-01-08 ENCOUNTER — Telehealth: Payer: Self-pay | Admitting: Gastroenterology

## 2017-01-08 NOTE — Telephone Encounter (Signed)
Left message for patient with recommendations.

## 2017-01-08 NOTE — Telephone Encounter (Signed)
As long as he does not have a first degree family member with colon cancer diagnosed at age less than 65 he would not need another colonoscopy for 10 years based on guidelines. Thanks

## 2017-01-08 NOTE — Telephone Encounter (Signed)
Routing this family history information in response to your letter on his colonoscopy.

## 2017-11-02 ENCOUNTER — Ambulatory Visit (INDEPENDENT_AMBULATORY_CARE_PROVIDER_SITE_OTHER): Payer: 59 | Admitting: Family Medicine

## 2017-11-02 ENCOUNTER — Encounter: Payer: Self-pay | Admitting: Family Medicine

## 2017-11-02 VITALS — BP 122/80 | HR 97 | Temp 98.8°F | Ht 68.0 in | Wt 178.8 lb

## 2017-11-02 DIAGNOSIS — R739 Hyperglycemia, unspecified: Secondary | ICD-10-CM | POA: Diagnosis not present

## 2017-11-02 DIAGNOSIS — Z0000 Encounter for general adult medical examination without abnormal findings: Secondary | ICD-10-CM | POA: Diagnosis not present

## 2017-11-02 LAB — POC URINALSYSI DIPSTICK (AUTOMATED)
Bilirubin, UA: NEGATIVE
Blood, UA: NEGATIVE
Glucose, UA: NEGATIVE
Ketones, UA: NEGATIVE
Leukocytes, UA: NEGATIVE
Nitrite, UA: NEGATIVE
Protein, UA: NEGATIVE
Spec Grav, UA: 1.01 (ref 1.010–1.025)
Urobilinogen, UA: 0.2 E.U./dL
pH, UA: 6 (ref 5.0–8.0)

## 2017-11-02 LAB — HEPATIC FUNCTION PANEL
ALT: 15 U/L (ref 0–53)
AST: 19 U/L (ref 0–37)
Albumin: 4.4 g/dL (ref 3.5–5.2)
Alkaline Phosphatase: 70 U/L (ref 39–117)
Bilirubin, Direct: 0.1 mg/dL (ref 0.0–0.3)
TOTAL PROTEIN: 7.1 g/dL (ref 6.0–8.3)
Total Bilirubin: 0.6 mg/dL (ref 0.2–1.2)

## 2017-11-02 LAB — LIPID PANEL
Cholesterol: 187 mg/dL (ref 0–200)
HDL: 47 mg/dL (ref >39.00)
LDL Cholesterol: 125 mg/dL — ABNORMAL HIGH (ref 0–99)
NonHDL: 139.96
Total CHOL/HDL Ratio: 4
Triglycerides: 73 mg/dL (ref 0.0–149.0)
VLDL: 14.6 mg/dL (ref 0.0–40.0)

## 2017-11-02 LAB — BASIC METABOLIC PANEL
BUN: 13 mg/dL (ref 6–23)
CALCIUM: 9.4 mg/dL (ref 8.4–10.5)
CHLORIDE: 101 meq/L (ref 96–112)
CO2: 29 meq/L (ref 19–32)
CREATININE: 0.93 mg/dL (ref 0.40–1.50)
GFR: 91.04 mL/min (ref >60.00)
Glucose, Bld: 93 mg/dL (ref 70–99)
Potassium: 4.4 mEq/L (ref 3.5–5.1)
Sodium: 136 mEq/L (ref 135–145)

## 2017-11-02 LAB — PSA: PSA: 0.41 ng/mL (ref 0.10–4.00)

## 2017-11-02 LAB — CBC WITH DIFFERENTIAL/PLATELET
BASOS ABS: 0 10*3/uL (ref 0.0–0.1)
Basophils Relative: 0.5 % (ref 0.0–3.0)
EOS ABS: 0.1 10*3/uL (ref 0.0–0.7)
Eosinophils Relative: 2.6 % (ref 0.0–5.0)
HCT: 41.3 % (ref 39.0–52.0)
Hemoglobin: 14.2 g/dL (ref 13.0–17.0)
LYMPHS ABS: 1.8 10*3/uL (ref 0.7–4.0)
Lymphocytes Relative: 36.2 % (ref 12.0–46.0)
MCHC: 34.4 g/dL (ref 30.0–36.0)
MCV: 88.7 fl (ref 78.0–100.0)
Monocytes Absolute: 0.5 10*3/uL (ref 0.1–1.0)
Monocytes Relative: 10.7 % (ref 3.0–12.0)
NEUTROS PCT: 50 % (ref 43.0–77.0)
Neutro Abs: 2.4 10*3/uL (ref 1.4–7.7)
PLATELETS: 272 10*3/uL (ref 150.0–400.0)
RBC: 4.66 Mil/uL (ref 4.22–5.81)
RDW: 13.8 % (ref 11.5–15.5)
WBC: 4.9 10*3/uL (ref 4.0–10.5)

## 2017-11-02 LAB — HEMOGLOBIN A1C: HEMOGLOBIN A1C: 5.5 % (ref 4.6–6.5)

## 2017-11-02 LAB — TSH: TSH: 1.53 u[IU]/mL (ref 0.35–4.50)

## 2017-11-02 MED ORDER — ALPRAZOLAM 0.5 MG PO TABS: 0.5000 mg | ORAL_TABLET | Freq: Two times a day (BID) | ORAL | 5 refills | Status: DC | PRN

## 2017-11-02 NOTE — Progress Notes (Signed)
   Subjective:    Patient ID: Jackson Smith, male    DOB: Jan 27, 1966, 51 y.o.   MRN: [id number]  HPI Here for a well exam. He feels well.    Review of Systems  Constitutional: Negative.   HENT: Negative.   Eyes: Negative.   Respiratory: Negative.   Cardiovascular: Negative.   Gastrointestinal: Negative.   Genitourinary: Negative.   Musculoskeletal: Negative.   Skin: Negative.   Neurological: Negative.   Psychiatric/Behavioral: Negative.        Objective:   Physical Exam  Constitutional: He is oriented to person, place, and time. He appears well-developed and well-nourished. No distress.  HENT:  Head: Normocephalic and atraumatic.  Right Ear: External ear normal.  Left Ear: External ear normal.  Nose: Nose normal.  Mouth/Throat: Oropharynx is clear and moist. No oropharyngeal exudate.  Eyes: Conjunctivae and EOM are normal. Pupils are equal, round, and reactive to light. Right eye exhibits no discharge. Left eye exhibits no discharge. No scleral icterus.  Neck: Neck supple. No JVD present. No tracheal deviation present. No thyromegaly present.  Cardiovascular: Normal rate, regular rhythm, normal heart sounds and intact distal pulses. Exam reveals no gallop and no friction rub.  No murmur heard. Pulmonary/Chest: Effort normal and breath sounds normal. No respiratory distress. He has no wheezes. He has no rales. He exhibits no tenderness.  Abdominal: Soft. Bowel sounds are normal. He exhibits no distension and no mass. There is no tenderness. There is no rebound and no guarding.  Genitourinary: Rectum normal, prostate normal and penis normal. Rectal exam shows guaiac negative stool. No penile tenderness.  Musculoskeletal: Normal range of motion. He exhibits no edema or tenderness.  Lymphadenopathy:    He has no cervical adenopathy.  Neurological: He is alert and oriented to person, place, and time. He has normal reflexes. No cranial nerve deficit. He exhibits normal muscle  tone. Coordination normal.  Skin: Skin is warm and dry. No rash noted. He is not diaphoretic. No erythema. No pallor.  Psychiatric: He has a normal mood and affect. His behavior is normal. Judgment and thought content normal.          Assessment & Plan:  Well exam. We discussed diet and exercise. Get fasting labs. Alysia Penna, MD

## 2017-11-02 NOTE — Telephone Encounter (Signed)
This encounter was created in error - please disregard.

## 2017-11-30 ENCOUNTER — Telehealth: Payer: Self-pay | Admitting: Family Medicine

## 2017-11-30 NOTE — Telephone Encounter (Signed)
Copied from [place]. Topic: Quick Communication - See Telephone Encounter >> Nov 30, 2017  9:50 AM Oneta Rack wrote: CRM for notification. See Telephone encounter for:   11/30/17.   Relation to pt: self Call back number: [phone]    Reason for call:  Patient contacted employer today and employer states biometric screening form never received, patient requesting form re fax to the number indicated on the form. Patient did state on 11/06/17 voicemail was left by office confirming form was faxed, please leave detail message regarding if form can be re faxed, patient currently at work.

## 2017-11-30 NOTE — Telephone Encounter (Signed)
I printed and refaxed form to number on form.

## 2018-01-16 ENCOUNTER — Encounter: Payer: Self-pay | Admitting: Family Medicine

## 2018-01-16 ENCOUNTER — Ambulatory Visit (INDEPENDENT_AMBULATORY_CARE_PROVIDER_SITE_OTHER): Payer: 59 | Admitting: Family Medicine

## 2018-01-16 VITALS — BP 104/60 | HR 67 | Temp 97.9°F | Wt 179.2 lb

## 2018-01-16 DIAGNOSIS — J029 Acute pharyngitis, unspecified: Secondary | ICD-10-CM | POA: Diagnosis not present

## 2018-01-16 MED ORDER — CEPHALEXIN 500 MG PO CAPS: 500.0000 mg | ORAL_CAPSULE | Freq: Three times a day (TID) | ORAL | 0 refills | Status: AC

## 2018-01-16 NOTE — Progress Notes (Signed)
   Subjective:    Patient ID: Jackson Smith, male    DOB: 03/03/1966, 52 y.o.   MRN: [id number]  HPI Here for 2 weeks of a ST. At first it was more severe and the whole back of his throat was involved. No fever. Since then it has improved but a small area of soreness remains on the left side. No sinus congestion or cough.    Review of Systems  Constitutional: Negative.   HENT: Positive for sore throat. Negative for congestion, postnasal drip, sinus pressure and sinus pain.   Eyes: Negative.   Respiratory: Negative.        Objective:   Physical Exam  Constitutional: He appears well-developed and well-nourished.  HENT:  Right Ear: External ear normal.  Left Ear: External ear normal.  Nose: Nose normal.  There is an area of erythema on the left posterior OP. No exudate.   Eyes: Conjunctivae are normal.  Neck: No thyromegaly present.  Shotty tender nodes in the left [facility] chain           Assessment & Plan:  Pharyngitis, treat with Keflex.  Alysia Penna, MD

## 2018-01-22 ENCOUNTER — Ambulatory Visit (INDEPENDENT_AMBULATORY_CARE_PROVIDER_SITE_OTHER): Payer: 59 | Admitting: Family Medicine

## 2018-01-22 ENCOUNTER — Encounter: Payer: Self-pay | Admitting: Family Medicine

## 2018-01-22 VITALS — BP 102/64 | HR 69 | Temp 97.9°F | Wt 181.0 lb

## 2018-01-22 DIAGNOSIS — K121 Other forms of stomatitis: Secondary | ICD-10-CM

## 2018-01-22 MED ORDER — NYSTATIN 100000 UNIT/ML MT SUSP: 5.0000 mL | Freq: Four times a day (QID) | OROMUCOSAL | 0 refills | Status: DC

## 2018-01-22 NOTE — Progress Notes (Signed)
   Subjective:    Patient ID: Jackson Smith, male    DOB: 1966-03-03, 52 y.o.   MRN: [id number]  HPI Here for several days of a mild burning discomfort on the tongue and in the back of the mouth. He saw some white spots yesterday. He is almost finished with a course of Keflex for a pharyngitis, and the sore throat and swollen neck nodes have resolved.    Review of Systems  Constitutional: Negative.   HENT: Negative for congestion, sinus pressure, sinus pain and sore throat.   Eyes: Negative.   Respiratory: Negative.        Objective:   Physical Exam  Constitutional: He appears well-developed and well-nourished.  HENT:  Right Ear: External ear normal.  Left Ear: External ear normal.  Nose: Nose normal.  Mouth/Throat: Oropharynx is clear and moist.  Eyes: Conjunctivae are normal.  Neck: No thyromegaly present.  Pulmonary/Chest: Effort normal and breath sounds normal. No respiratory distress. He has no wheezes. He has no rales.  Lymphadenopathy:    He has no cervical adenopathy.          Assessment & Plan:  Probable thrush. Treat with Nystatin suspension.  Alysia Penna, MD

## 2018-02-06 DIAGNOSIS — J040 Acute laryngitis: Secondary | ICD-10-CM | POA: Diagnosis not present

## 2018-02-06 DIAGNOSIS — R070 Pain in throat: Secondary | ICD-10-CM | POA: Diagnosis not present

## 2018-03-01 DIAGNOSIS — K140 Glossitis: Secondary | ICD-10-CM | POA: Diagnosis not present

## 2018-03-01 DIAGNOSIS — J040 Acute laryngitis: Secondary | ICD-10-CM | POA: Diagnosis not present

## 2018-03-01 DIAGNOSIS — K148 Other diseases of tongue: Secondary | ICD-10-CM | POA: Diagnosis not present

## 2018-10-04 ENCOUNTER — Ambulatory Visit: Payer: Self-pay | Admitting: *Deleted

## 2018-10-04 ENCOUNTER — Encounter: Payer: Self-pay | Admitting: Family Medicine

## 2018-10-04 ENCOUNTER — Ambulatory Visit (INDEPENDENT_AMBULATORY_CARE_PROVIDER_SITE_OTHER): Payer: 59 | Admitting: Family Medicine

## 2018-10-04 VITALS — BP 112/80 | HR 72 | Temp 98.0°F | Wt 184.1 lb

## 2018-10-04 DIAGNOSIS — K219 Gastro-esophageal reflux disease without esophagitis: Secondary | ICD-10-CM

## 2018-10-04 MED ORDER — OMEPRAZOLE 20 MG PO CPDR: 20.0000 mg | DELAYED_RELEASE_CAPSULE | Freq: Every day | ORAL | 0 refills | Status: DC

## 2018-10-04 MED ORDER — ALPRAZOLAM 0.5 MG PO TABS: 0.5000 mg | ORAL_TABLET | Freq: Two times a day (BID) | ORAL | 2 refills | Status: DC | PRN

## 2018-10-07 ENCOUNTER — Encounter: Payer: Self-pay | Admitting: Family Medicine

## 2018-10-07 NOTE — Progress Notes (Signed)
   Subjective:    Patient ID: Jackson Smith, male    DOB: February 12, 1966, 52 y.o.   MRN: [id number]  HPI Here for 3 days of intermittent mild symptoms including tightness in the chest and neck and jaws, along with frequent heartburn. No SOB or nausea. No trouble swallowing. These symptoms are not related to exertion. They often start about 30 minutes after a meal.    Review of Systems  Constitutional: Negative.   HENT: Negative.   Eyes: Negative.   Respiratory: Negative.   Cardiovascular: Positive for chest pain. Negative for palpitations and leg swelling.  Gastrointestinal: Negative.   Neurological: Negative.        Objective:   Physical Exam  Constitutional: He is oriented to person, place, and time. He appears well-developed and well-nourished. He does not appear ill. No distress.  Neck: No thyromegaly present.  Cardiovascular: Normal rate, regular rhythm, normal heart sounds and intact distal pulses.  Pulmonary/Chest: Effort normal and breath sounds normal. No stridor. No respiratory distress. He has no wheezes. He has no rales. He exhibits no tenderness.  Abdominal: Soft. Bowel sounds are normal. He exhibits no distension and no mass. There is no tenderness. There is no rebound and no guarding.  Lymphadenopathy:    He has no cervical adenopathy.  Neurological: He is alert and oriented to person, place, and time.          Assessment & Plan:  Chest tightness which is likely related to esophageal spasms and GERD. He will try Omeprazole 20 mg daily for 2 weeks and report back to [place].  Alysia Penna, MD

## 2018-10-30 ENCOUNTER — Encounter: Payer: Self-pay | Admitting: Family Medicine

## 2018-10-30 ENCOUNTER — Ambulatory Visit (INDEPENDENT_AMBULATORY_CARE_PROVIDER_SITE_OTHER): Payer: 59 | Admitting: Family Medicine

## 2018-10-30 VITALS — BP 110/60 | HR 66 | Temp 98.2°F | Wt 182.1 lb

## 2018-10-30 DIAGNOSIS — J069 Acute upper respiratory infection, unspecified: Secondary | ICD-10-CM | POA: Diagnosis not present

## 2018-10-30 NOTE — Progress Notes (Signed)
   Subjective:    Patient ID: Jackson Smith, male    DOB: 17-Nov-1966, 52 y.o.   MRN: [id number]  HPI Here for 2 days of PND, hoarseness and a dry cough. No fever. Drinking fluids.    Review of Systems  Constitutional: Negative.   HENT: Positive for postnasal drip, sore throat and voice change. Negative for congestion, ear pain, sinus pressure and sinus pain.   Eyes: Negative.   Respiratory: Positive for cough.        Objective:   Physical Exam  Constitutional: He appears well-developed and well-nourished.  HENT:  Right Ear: External ear normal.  Left Ear: External ear normal.  Nose: Nose normal.  Mouth/Throat: Oropharynx is clear and moist.  Eyes: Conjunctivae are normal.  Neck: No thyromegaly present.  Pulmonary/Chest: Effort normal and breath sounds normal. No stridor. No respiratory distress. He has no wheezes. He has no rales.  Lymphadenopathy:    He has no cervical adenopathy.          Assessment & Plan:  Viral URI. He can use Delsym prn.  Alysia Penna, MD

## 2018-11-08 ENCOUNTER — Ambulatory Visit (INDEPENDENT_AMBULATORY_CARE_PROVIDER_SITE_OTHER): Payer: 59 | Admitting: Family Medicine

## 2018-11-08 ENCOUNTER — Encounter: Payer: Self-pay | Admitting: Family Medicine

## 2018-11-08 VITALS — BP 110/60 | HR 71 | Temp 98.0°F | Ht 68.0 in | Wt 181.2 lb

## 2018-11-08 DIAGNOSIS — Z0000 Encounter for general adult medical examination without abnormal findings: Secondary | ICD-10-CM

## 2018-11-08 DIAGNOSIS — Z23 Encounter for immunization: Secondary | ICD-10-CM | POA: Diagnosis not present

## 2018-11-08 DIAGNOSIS — Z125 Encounter for screening for malignant neoplasm of prostate: Secondary | ICD-10-CM

## 2018-11-08 LAB — HEPATIC FUNCTION PANEL
ALT: 17 U/L (ref 0–53)
AST: 19 U/L (ref 0–37)
Albumin: 4.4 g/dL (ref 3.5–5.2)
Alkaline Phosphatase: 66 U/L (ref 39–117)
BILIRUBIN DIRECT: 0.1 mg/dL (ref 0.0–0.3)
BILIRUBIN TOTAL: 0.7 mg/dL (ref 0.2–1.2)
TOTAL PROTEIN: 7 g/dL (ref 6.0–8.3)

## 2018-11-08 LAB — LIPID PANEL
CHOL/HDL RATIO: 5
Cholesterol: 192 mg/dL (ref 0–200)
HDL: 42.1 mg/dL (ref >39.00)
LDL Cholesterol: 133 mg/dL — ABNORMAL HIGH (ref 0–99)
NonHDL: 150.11
Triglycerides: 88 mg/dL (ref 0.0–149.0)
VLDL: 17.6 mg/dL (ref 0.0–40.0)

## 2018-11-08 LAB — POC URINALSYSI DIPSTICK (AUTOMATED)
Bilirubin, UA: NEGATIVE
GLUCOSE UA: NEGATIVE
Ketones, UA: NEGATIVE
LEUKOCYTES UA: NEGATIVE
Nitrite, UA: NEGATIVE
PROTEIN UA: NEGATIVE
Spec Grav, UA: 1.015 (ref 1.010–1.025)
UROBILINOGEN UA: 0.2 U/dL
pH, UA: 6 (ref 5.0–8.0)

## 2018-11-08 LAB — CBC WITH DIFFERENTIAL/PLATELET
BASOS PCT: 0.6 % (ref 0.0–3.0)
Basophils Absolute: 0 10*3/uL (ref 0.0–0.1)
EOS PCT: 3.1 % (ref 0.0–5.0)
Eosinophils Absolute: 0.1 10*3/uL (ref 0.0–0.7)
HEMATOCRIT: 41.5 % (ref 39.0–52.0)
HEMOGLOBIN: 14.4 g/dL (ref 13.0–17.0)
Lymphocytes Relative: 34.4 % (ref 12.0–46.0)
Lymphs Abs: 1.5 10*3/uL (ref 0.7–4.0)
MCHC: 34.6 g/dL (ref 30.0–36.0)
MCV: 87 fl (ref 78.0–100.0)
MONO ABS: 0.4 10*3/uL (ref 0.1–1.0)
Monocytes Relative: 10.1 % (ref 3.0–12.0)
Neutro Abs: 2.3 10*3/uL (ref 1.4–7.7)
Neutrophils Relative %: 51.8 % (ref 43.0–77.0)
Platelets: 249 10*3/uL (ref 150.0–400.0)
RBC: 4.77 Mil/uL (ref 4.22–5.81)
RDW: 13.6 % (ref 11.5–15.5)
WBC: 4.5 10*3/uL (ref 4.0–10.5)

## 2018-11-08 LAB — BASIC METABOLIC PANEL
BUN: 15 mg/dL (ref 6–23)
CALCIUM: 9.4 mg/dL (ref 8.4–10.5)
CO2: 30 mEq/L (ref 19–32)
CREATININE: 1.08 mg/dL (ref 0.40–1.50)
Chloride: 102 mEq/L (ref 96–112)
GFR: 76.31 mL/min (ref >60.00)
Glucose, Bld: 100 mg/dL — ABNORMAL HIGH (ref 70–99)
Potassium: 4.6 mEq/L (ref 3.5–5.1)
SODIUM: 137 meq/L (ref 135–145)

## 2018-11-08 LAB — PSA: PSA: 0.43 ng/mL (ref 0.10–4.00)

## 2018-11-08 LAB — TSH: TSH: 1.65 u[IU]/mL (ref 0.35–4.50)

## 2018-11-08 MED ORDER — TRIAMCINOLONE ACETONIDE 0.1 % EX CREA: 1.0000 "application " | TOPICAL_CREAM | Freq: Two times a day (BID) | CUTANEOUS | 2 refills | Status: DC

## 2018-11-08 NOTE — Progress Notes (Signed)
   Subjective:    Patient ID: Jackson Smith, male    DOB: [birth year], 52 y.o.   MRN: [id number]  HPI Here for a well exam. He is doing well. He is getting over a viral URI that he saw [place] for.    Review of Systems  Constitutional: Negative.   HENT: Negative.   Eyes: Negative.   Respiratory: Negative.   Cardiovascular: Negative.   Gastrointestinal: Negative.   Genitourinary: Negative.   Musculoskeletal: Negative.   Skin: Negative.   Neurological: Negative.   Psychiatric/Behavioral: Negative.        Objective:   Physical Exam  Constitutional: He is oriented to person, place, and time. He appears well-developed and well-nourished. No distress.  HENT:  Head: Normocephalic and atraumatic.  Right Ear: External ear normal.  Left Ear: External ear normal.  Nose: Nose normal.  Mouth/Throat: Oropharynx is clear and moist. No oropharyngeal exudate.  Eyes: Pupils are equal, round, and reactive to light. Conjunctivae and EOM are normal. Right eye exhibits no discharge. Left eye exhibits no discharge. No scleral icterus.  Neck: Neck supple. No JVD present. No tracheal deviation present. No thyromegaly present.  Cardiovascular: Normal rate, regular rhythm, normal heart sounds and intact distal pulses. Exam reveals no gallop and no friction rub.  No murmur heard. Pulmonary/Chest: Effort normal and breath sounds normal. No respiratory distress. He has no wheezes. He has no rales. He exhibits no tenderness.  Abdominal: Soft. Bowel sounds are normal. He exhibits no distension and no mass. There is no tenderness. There is no rebound and no guarding.  Genitourinary: Rectum normal, prostate normal and penis normal. Rectal exam shows guaiac negative stool. No penile tenderness.  Musculoskeletal: Normal range of motion. He exhibits no edema or tenderness.  Lymphadenopathy:    He has no cervical adenopathy.  Neurological: He is alert and oriented to person, place, and time. He has normal reflexes.  He displays normal reflexes. No cranial nerve deficit. He exhibits normal muscle tone. Coordination normal.  Skin: Skin is warm and dry. No rash noted. He is not diaphoretic. No erythema. No pallor.  Psychiatric: He has a normal mood and affect. His behavior is normal. Judgment and thought content normal.          Assessment & Plan:  Well exam. We discussed diet and exercise. Get fasting labs.  Alysia Penna, MD

## 2018-11-11 ENCOUNTER — Telehealth: Payer: Self-pay | Admitting: Family Medicine

## 2018-11-11 NOTE — Telephone Encounter (Signed)
Copied from [place] [phone]. Topic: Quick Communication - See Telephone Encounter >> Nov 11, 2018  4:35 PM Blase Mess A wrote: CRM for notification. See Telephone encounter for: 11/11/18.  Patient is calling to receive his lab results please advise [phone]

## 2018-11-12 ENCOUNTER — Telehealth: Payer: Self-pay

## 2018-11-12 NOTE — Telephone Encounter (Signed)
Pt. Given lab results and instructions.Verbalizes understanding. 

## 2018-11-27 ENCOUNTER — Encounter: Payer: Self-pay | Admitting: Family Medicine

## 2018-11-27 ENCOUNTER — Ambulatory Visit (INDEPENDENT_AMBULATORY_CARE_PROVIDER_SITE_OTHER): Payer: 59 | Admitting: Family Medicine

## 2018-11-27 VITALS — BP 120/80 | HR 63 | Temp 98.0°F | Wt 185.4 lb

## 2018-11-27 DIAGNOSIS — R1010 Upper abdominal pain, unspecified: Secondary | ICD-10-CM | POA: Diagnosis not present

## 2018-11-27 MED ORDER — ALIGN 4 MG PO CAPS: 1.0000 | ORAL_CAPSULE | Freq: Two times a day (BID) | ORAL | 0 refills | Status: DC

## 2018-11-27 MED ORDER — POLYETHYLENE GLYCOL 3350 17 GM/SCOOP PO POWD: 17.0000 g | Freq: Two times a day (BID) | ORAL | 1 refills | Status: DC | PRN

## 2018-11-27 NOTE — Progress Notes (Signed)
   Subjective:    Patient ID: Jackson Smith, male    DOB: Jan 01, 1966, 52 y.o.   MRN: [id number]  HPI Here for 4 days of left flank and LUQ pain that comes and goes. This is subtle and mild. No nausea or fever . He has been constipated and he has tried Milk of Magnesia a few times. When he takes this he has a small BM, but otherwise his bowels are sluggish. He takes Omeprazole daily and he seldom has any heartburn.    Review of Systems     Objective:   Physical Exam        Assessment & Plan:

## 2018-12-04 ENCOUNTER — Ambulatory Visit
Admission: RE | Admit: 2018-12-04 | Discharge: 2018-12-04 | Disposition: A | Discharge status: Home / Self Care | Payer: 59 | Source: Ambulatory Visit | Attending: Family Medicine | Admitting: Family Medicine

## 2018-12-04 DIAGNOSIS — R1010 Upper abdominal pain, unspecified: Secondary | ICD-10-CM

## 2018-12-04 DIAGNOSIS — K828 Other specified diseases of gallbladder: Secondary | ICD-10-CM | POA: Diagnosis not present

## 2018-12-04 DIAGNOSIS — K8020 Calculus of gallbladder without cholecystitis without obstruction: Secondary | ICD-10-CM | POA: Diagnosis not present

## 2018-12-06 ENCOUNTER — Encounter: Payer: Self-pay | Admitting: *Deleted

## 2018-12-06 DIAGNOSIS — K8020 Calculus of gallbladder without cholecystitis without obstruction: Secondary | ICD-10-CM

## 2018-12-09 NOTE — Telephone Encounter (Signed)
Pt would like to have the referral for the surgeon to see if he can get in before the end of the year.  Thanks

## 2018-12-12 NOTE — Telephone Encounter (Signed)
The referral was done  

## 2018-12-31 ENCOUNTER — Other Ambulatory Visit: Payer: Self-pay | Admitting: General Surgery

## 2018-12-31 DIAGNOSIS — K8020 Calculus of gallbladder without cholecystitis without obstruction: Secondary | ICD-10-CM | POA: Diagnosis not present

## 2018-12-31 DIAGNOSIS — D6851 Activated protein C resistance: Secondary | ICD-10-CM | POA: Diagnosis not present

## 2019-01-03 NOTE — H&P (Signed)
Jackson Smith Location: [facility] Patient #: [id number] DOB: 05/25/1966 Single / Language: Jackson Smith / Race: White Male        History of Present Illness      The patient is a 53 year old male who presents for evaluation of gall stones. This is a healthy 53 year old man, referred by Dr. Alysia Smith for evaluation of symptomatic gallstones.     He had a normal wellness visit in November was normal CBC and normal LFTs. In December he had a one-week episode of intermittent moderately severe epigastric pain radiating to the back. No real GI symptoms but he said he really didn't feel like eating. Did not think that meals exacerbated the pain. He's been better the last week or 2. Chronically constipated. No diarrhea. Ultrasound on December 04, 2018 shows multiple small gallstones. CBD 3.5 mm. No other abnormalities. Asymptomatic today     Past history of mild anxiety. GERD. Factor V Leiden mutation. Left groin surgery for testicular ischemia. Ankle surgery. No bleeding diaphysis. Family history reveals father died of heart disease. Mother living and well Visit reveals he single. No children. His mother lives next door to him. He does not smoke. He does not drink. 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       I think that it is quite likely that he was having gallbladder attacks. No apparent complications to date.. I told him that this would probably recur at some point in the future. He was strongly motivated to go ahead and have his gallbladder out so that this would not happen again. That is reasonable and he will be scheduled for laparoscopic cholecystectomy with possible cholangiogram. I discussed the indications, details, techniques, and risk of the surgery with him in detail. He is aware the risk of bleeding, infection, conversion to open laparotomy, port site hernia, bile leak, injury to adjacent organs  with major reconstructive surgery, hospitalization for common duct stones, diarrhea. He understands all these issues. All of his questions are answered. He agrees with this plan.     He wants to be recovered from this surgery so that he can take care of his mother who is having some type of orthopedic surgery in mid February. We will try to schedule his surgery before February 1.   Past Surgical History  Colon Polyp Removal - Colonoscopy   Allergies  Penicillins  Tree nuts (walnuts, cashews)  Allergies Reconciled   Medication History  ALPRAZolam (0.5MG  Tablet, Oral) Active. Medications Reconciled  Social History No alcohol use  No drug use  Tobacco use  Never smoker.  Family History  Arthritis  Mother. Breast Cancer  Sister. Cerebrovascular Accident  Sister. Diabetes Mellitus  Brother, Father, Sister. Heart Disease  Father. Hypertension  Father. Kidney Disease  Father.  Other Problems  Cholelithiasis  Gastroesophageal Reflux Disease     Review of Systems  General Not Present- Appetite Loss, Chills, Fatigue, Fever, Night Sweats, Weight Gain and Weight Loss. Skin Present- Rash. Not Present- Change in Wart/Mole, Dryness, Hives, Jaundice, New Lesions, Non-Healing Wounds and Ulcer. HEENT Present- Seasonal Allergies and Wears glasses/contact lenses. Not Present- Earache, Hearing Loss, Hoarseness, Nose Bleed, Oral Ulcers, Ringing in the Ears, Sinus Pain, Sore Throat, Visual Disturbances and Yellow Eyes. Breast Not Present- Breast Mass, Breast Pain, Nipple Discharge and Skin Changes. Gastrointestinal Present- Constipation and Indigestion. Not Present- Abdominal Pain, Bloating, Bloody Stool, Change in Bowel Habits, Chronic diarrhea, Difficulty Swallowing,  Excessive gas, Gets full quickly at meals, Hemorrhoids, Nausea, Rectal Pain and Vomiting.  Vitals Weight: 185 lb Height: 68in Body Surface Area: 1.98 m Body Mass Index: 28.13 kg/m  Temp.: 98.11F   Pulse: 78 (Regular)  BP: 112/68 (Sitting, Left Arm, Standard)       Physical Exam  General Mental Status-Alert. General Appearance-Consistent with stated age. Hydration-Well hydrated. Voice-Normal.  Head and Neck Head-normocephalic, atraumatic with no lesions or palpable masses. Trachea-midline. Thyroid Gland Characteristics - normal size and consistency.  Eye Eyeball - Bilateral-Extraocular movements intact. Sclera/Conjunctiva - Bilateral-No scleral icterus.  Chest and Lung Exam Chest and lung exam reveals -quiet, even and easy respiratory effort with no use of accessory muscles and on auscultation, normal breath sounds, no adventitious sounds and normal vocal resonance. Inspection Chest Wall - Normal. Back - normal.  Cardiovascular Cardiovascular examination reveals -normal heart sounds, regular rate and rhythm with no murmurs and normal pedal pulses bilaterally.  Abdomen Inspection Inspection of the abdomen reveals - No Hernias. Skin - Scar - no surgical scars. Palpation/Percussion Palpation and Percussion of the abdomen reveal - Soft, No Rebound tenderness, No Rigidity (guarding) and No hepatosplenomegaly. Note: Intermittently tender to deep palpation right mid abdomen, no mass. No guarding. Auscultation Auscultation of the abdomen reveals - Bowel sounds normal.  Male Genitourinary Note: Old scar left inguinal area   Neurologic Neurologic evaluation reveals -alert and oriented x 3 with no impairment of recent or remote memory. Mental Status-Normal.  Musculoskeletal Normal Exam - Left-Upper Extremity Strength Normal and Lower Extremity Strength Normal. Normal Exam - Right-Upper Extremity Strength Normal and Lower Extremity Strength Normal.  Lymphatic Head & Neck  General Head & Neck Lymphatics: Bilateral - Description - Normal. Axillary  General Axillary Region: Bilateral - Description - Normal. Tenderness - Non  Tender. Femoral & Inguinal  Generalized Femoral & Inguinal Lymphatics: Bilateral - Description - Normal. Tenderness - Non Tender.    Assessment & Plan GALLSTONES (K80.20)    Last month you had intermittent episodes of upper abdominal pain radiating to your back In November your liver function tests were normal but you have not had lab work since then Ultrasound shows multiple gallstones but no other abnormality Most likely these episodes of pain were due to your gallstones Although there is no emergency, this is very likely to recur in the future and can progress  You have requested that we proceed with cholecystectomy and that is appropriate you will be scheduled for laparoscopic cholecystectomy, possible cholangiogram x-ray in the near future We will try to get this done by February 1 so you can be recovered by the time your mother has her surgery in mid February  I have discussed the indications, techniques, and risks of the surgery in detail Please read over the patient information booklet that I reviewed with you  FACTOR V LEIDEN (D68.51)    Edsel Petrin. Dalbert Batman, M.D., [facility] General and Minimally invasive Surgery Breast and Colorectal Surgery Office:   [phone] Pager:   [phone]

## 2019-01-07 ENCOUNTER — Other Ambulatory Visit (HOSPITAL_COMMUNITY): Payer: 59

## 2019-01-07 ENCOUNTER — Encounter (HOSPITAL_COMMUNITY)
Admission: RE | Admit: 2019-01-07 | Discharge: 2019-01-07 | Disposition: A | Discharge status: Home / Self Care | Payer: 59 | Source: Ambulatory Visit | Attending: General Surgery | Admitting: General Surgery

## 2019-01-07 ENCOUNTER — Encounter (HOSPITAL_COMMUNITY): Payer: Self-pay

## 2019-01-07 ENCOUNTER — Other Ambulatory Visit: Payer: Self-pay

## 2019-01-07 DIAGNOSIS — F419 Anxiety disorder, unspecified: Secondary | ICD-10-CM | POA: Diagnosis not present

## 2019-01-07 DIAGNOSIS — K8010 Calculus of gallbladder with chronic cholecystitis without obstruction: Secondary | ICD-10-CM | POA: Diagnosis present

## 2019-01-07 DIAGNOSIS — Z79899 Other long term (current) drug therapy: Secondary | ICD-10-CM | POA: Diagnosis not present

## 2019-01-07 DIAGNOSIS — Z01812 Encounter for preprocedural laboratory examination: Secondary | ICD-10-CM

## 2019-01-07 DIAGNOSIS — Z880 Allergy status to penicillin: Secondary | ICD-10-CM | POA: Diagnosis not present

## 2019-01-07 DIAGNOSIS — K5909 Other constipation: Secondary | ICD-10-CM | POA: Diagnosis not present

## 2019-01-07 DIAGNOSIS — Z8249 Family history of ischemic heart disease and other diseases of the circulatory system: Secondary | ICD-10-CM | POA: Diagnosis not present

## 2019-01-07 DIAGNOSIS — D6851 Activated protein C resistance: Secondary | ICD-10-CM | POA: Diagnosis not present

## 2019-01-07 DIAGNOSIS — Z91018 Allergy to other foods: Secondary | ICD-10-CM | POA: Diagnosis not present

## 2019-01-07 HISTORY — DX: Anemia, unspecified: D64.9

## 2019-01-07 HISTORY — DX: Constipation, unspecified: K59.00

## 2019-01-07 LAB — COMPREHENSIVE METABOLIC PANEL
ALT: 21 U/L (ref 0–44)
AST: 24 U/L (ref 15–41)
Albumin: 4.2 g/dL (ref 3.5–5.0)
Alkaline Phosphatase: 56 U/L (ref 38–126)
Anion gap: 9 (ref 5–15)
BUN: 12 mg/dL (ref 6–20)
CO2: 24 mmol/L (ref 22–32)
CREATININE: 1.14 mg/dL (ref 0.61–1.24)
Calcium: 9 mg/dL (ref 8.9–10.3)
Chloride: 102 mmol/L (ref 98–111)
GFR calc non Af Amer: >60 mL/min (ref >60)
Glucose, Bld: 86 mg/dL (ref 70–99)
Potassium: 4.2 mmol/L (ref 3.5–5.1)
Sodium: 135 mmol/L (ref 135–145)
Total Bilirubin: 0.6 mg/dL (ref 0.3–1.2)
Total Protein: 7.3 g/dL (ref 6.5–8.1)

## 2019-01-07 LAB — CBC WITH DIFFERENTIAL/PLATELET
Abs Immature Granulocytes: 0.02 10*3/uL (ref 0.00–0.07)
Basophils Absolute: 0 10*3/uL (ref 0.0–0.1)
Basophils Relative: 1 %
Eosinophils Absolute: 0.1 10*3/uL (ref 0.0–0.5)
Eosinophils Relative: 2 %
HCT: 42 % (ref 39.0–52.0)
Hemoglobin: 13.5 g/dL (ref 13.0–17.0)
Immature Granulocytes: 0 %
Lymphocytes Relative: 33 %
Lymphs Abs: 1.5 10*3/uL (ref 0.7–4.0)
MCH: 28.5 pg (ref 26.0–34.0)
MCHC: 32.1 g/dL (ref 30.0–36.0)
MCV: 88.8 fL (ref 80.0–100.0)
Monocytes Absolute: 0.5 10*3/uL (ref 0.1–1.0)
Monocytes Relative: 10 %
Neutro Abs: 2.4 10*3/uL (ref 1.7–7.7)
Neutrophils Relative %: 54 %
Platelets: 243 10*3/uL (ref 150–400)
RBC: 4.73 MIL/uL (ref 4.22–5.81)
RDW: 12.4 % (ref 11.5–15.5)
WBC: 4.5 10*3/uL (ref 4.0–10.5)
nRBC: 0 % (ref 0.0–0.2)

## 2019-01-07 NOTE — Pre-Procedure Instructions (Signed)
Jackson Smith  01/07/2019    Your procedure is scheduled on Friday, January 10, 2019 at 10:00 AM.   Report to [facility] Entrance "A" Admitting Office at 8:00 AM.   Call this number if you have problems the morning of surgery: [phone]   Questions prior to day of surgery, please call [phone] between 8 & 4 PM.   Remember:  Do not eat food after midnight Thursday, 01/09/19  You may drink clear liquids until 7:00 AM.  Clear liquids allowed are: Water, Juice (non-pulp), Carbonated beverage, clear Tea, black Coffee, plain Jello, plain Popsicles, Gatorade                     Take these medicines the morning of surgery with A SIP OF WATER: Omeprazole (Prilosec) - if needed  Stop Allegra D and Probiotic as of today prior to surgery. Do not use Aspirin products or NSAIDS (Ibuprofen, Aleve, etc) prior to surgery.    Do not wear jewelry.  Do not wear lotions, powders, cologne or deodorant.  Men may shave face and neck.  Do not bring valuables to the hospital.  [facility] is not responsible for any belongings or valuables.  Contacts, dentures or bridgework may not be worn into surgery.  Leave your suitcase in the car.  After surgery it may be brought to your room.  For patients admitted to the hospital, discharge time will be determined by your treatment team.  Patients discharged the day of surgery will not be allowed to drive home.   [place] - Preparing for Surgery  Before surgery, you can play an important role.  Because skin is not sterile, your skin needs to be as free of germs as possible.  You can reduce the number of germs on you skin by washing with CHG (chlorahexidine gluconate) soap before surgery.  CHG is an antiseptic cleaner which kills germs and bonds with the skin to continue killing germs even after washing.  Oral Hygiene is also important in reducing the risk of infection.  Remember to brush your teeth with your regular toothpaste the morning of  surgery.  Please DO NOT use if you have an allergy to CHG or antibacterial soaps.  If your skin becomes reddened/irritated stop using the CHG and inform your nurse when you arrive at Short Stay.  Do not shave (including legs and underarms) for at least 48 hours prior to the first CHG shower.  You may shave your face.  Please follow these instructions carefully:   1.  Shower with CHG Soap the night before surgery and the morning of Surgery.  2.  If you choose to wash your hair, wash your hair first as usual with your normal shampoo.  3.  After you shampoo, rinse your hair and body thoroughly to remove the shampoo. 4.  Use CHG as you would any other liquid soap.  You can apply chg directly to the skin and wash gently with a      scrungie or washcloth.           5.  Apply the CHG Soap to your body ONLY FROM THE NECK DOWN.   Do not use on open wounds or open sores. Avoid contact with your eyes, ears, mouth and genitals (private parts).  Wash genitals (private parts) with your normal soap.  6.  Wash thoroughly, paying special attention to the area where your surgery will be performed.  7.  Thoroughly rinse your body  with warm water from the neck down.  8.  DO NOT shower/wash with your normal soap after using and rinsing off the CHG Soap.  9.  Pat yourself dry with a clean towel.            10.  Wear clean pajamas.            11.  Place clean sheets on your bed the night of your first shower and do not sleep with pets.  Day of Surgery  Shower as above. Do not apply any lotions/deodorants the morning of surgery.   Please wear clean clothes to the hospital. Remember to brush your teeth with toothpaste.   Please read over the fact sheets that you were given.

## 2019-01-07 NOTE — Progress Notes (Signed)
PCP - Dr. Alysia Penna Cardiologist - n/a  Chest x-ray - n/a EKG - n/a Stress Test - none ECHO - none Cardiac Cath - none  Sleep Study - no CPAP - n/a  Fasting Blood Sugar - n/a Checks Blood Sugar _____ times a day  Blood Thinner Instructions: n/a Aspirin Instructions: n/a  Anesthesia review: no  Patient denies shortness of breath, fever, cough and chest pain at PAT appointment   Patient verbalized understanding of instructions that were given to them at the PAT appointment. Patient was also instructed that they will need to review over the PAT instructions again at home before surgery.  Pt has hx of Factor Leiden Mutation, he states he has never had any problems with this. 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

## 2019-01-10 ENCOUNTER — Encounter (HOSPITAL_COMMUNITY)
Admission: RE | Disposition: A | Discharge status: Home / Self Care | Payer: Self-pay | Source: Home / Self Care | Attending: General Surgery

## 2019-01-10 ENCOUNTER — Ambulatory Visit (HOSPITAL_COMMUNITY): Payer: 59 | Admitting: Anesthesiology

## 2019-01-10 ENCOUNTER — Ambulatory Visit (HOSPITAL_COMMUNITY)
Admission: RE | Admit: 2019-01-10 | Discharge: 2019-01-10 | Disposition: A | Discharge status: Home / Self Care | Payer: 59 | Attending: General Surgery | Admitting: General Surgery

## 2019-01-10 ENCOUNTER — Other Ambulatory Visit: Payer: Self-pay

## 2019-01-10 ENCOUNTER — Encounter (HOSPITAL_COMMUNITY): Payer: Self-pay | Admitting: *Deleted

## 2019-01-10 DIAGNOSIS — F419 Anxiety disorder, unspecified: Secondary | ICD-10-CM | POA: Insufficient documentation

## 2019-01-10 DIAGNOSIS — K5909 Other constipation: Secondary | ICD-10-CM | POA: Diagnosis not present

## 2019-01-10 DIAGNOSIS — K8010 Calculus of gallbladder with chronic cholecystitis without obstruction: Secondary | ICD-10-CM | POA: Diagnosis present

## 2019-01-10 DIAGNOSIS — D6851 Activated protein C resistance: Secondary | ICD-10-CM | POA: Diagnosis present

## 2019-01-10 DIAGNOSIS — Z79899 Other long term (current) drug therapy: Secondary | ICD-10-CM | POA: Insufficient documentation

## 2019-01-10 DIAGNOSIS — Z91018 Allergy to other foods: Secondary | ICD-10-CM | POA: Insufficient documentation

## 2019-01-10 DIAGNOSIS — Z880 Allergy status to penicillin: Secondary | ICD-10-CM | POA: Insufficient documentation

## 2019-01-10 DIAGNOSIS — Z8249 Family history of ischemic heart disease and other diseases of the circulatory system: Secondary | ICD-10-CM | POA: Insufficient documentation

## 2019-01-10 HISTORY — PX: CHOLECYSTECTOMY: SHX55

## 2019-01-10 HISTORY — DX: Calculus of gallbladder with chronic cholecystitis without obstruction: K80.10

## 2019-01-10 SURGERY — LAPAROSCOPIC CHOLECYSTECTOMY WITH INTRAOPERATIVE CHOLANGIOGRAM
Anesthesia: General

## 2019-01-10 MED ORDER — METOCLOPRAMIDE HCL 5 MG/ML IJ SOLN
INTRAMUSCULAR | Status: AC
  Filled 2019-01-10: qty 2

## 2019-01-10 MED ORDER — CEFAZOLIN SODIUM-DEXTROSE 2-4 GM/100ML-% IV SOLN
2.0000 g | INTRAVENOUS | Status: AC
  Administered 2019-01-10: 2 g via INTRAVENOUS
  Filled 2019-01-10: qty 100

## 2019-01-10 MED ORDER — MIDAZOLAM HCL 5 MG/5ML IJ SOLN
INTRAMUSCULAR | Status: DC | PRN
  Administered 2019-01-10: 2 mg via INTRAVENOUS

## 2019-01-10 MED ORDER — LACTATED RINGERS IV SOLN: INTRAVENOUS | Status: DC

## 2019-01-10 MED ORDER — BUPIVACAINE-EPINEPHRINE 0.25% -1:200000 IJ SOLN
INTRAMUSCULAR | Status: AC
  Filled 2019-01-10: qty 1

## 2019-01-10 MED ORDER — GABAPENTIN 300 MG PO CAPS
300.0000 mg | ORAL_CAPSULE | ORAL | Status: AC
  Administered 2019-01-10: 300 mg via ORAL
  Filled 2019-01-10: qty 1

## 2019-01-10 MED ORDER — BUPIVACAINE-EPINEPHRINE 0.5% -1:200000 IJ SOLN
INTRAMUSCULAR | Status: DC | PRN
  Administered 2019-01-10: 20 mL

## 2019-01-10 MED ORDER — LIDOCAINE 2% (20 MG/ML) 5 ML SYRINGE
INTRAMUSCULAR | Status: AC
  Filled 2019-01-10: qty 5

## 2019-01-10 MED ORDER — CHLORHEXIDINE GLUCONATE CLOTH 2 % EX PADS: 6.0000 | MEDICATED_PAD | Freq: Once | CUTANEOUS | Status: DC

## 2019-01-10 MED ORDER — METOCLOPRAMIDE HCL 5 MG/ML IJ SOLN
10.0000 mg | Freq: Once | INTRAMUSCULAR | Status: AC | PRN
  Administered 2019-01-10: 10 mg via INTRAVENOUS

## 2019-01-10 MED ORDER — MIDAZOLAM HCL 2 MG/2ML IJ SOLN
INTRAMUSCULAR | Status: AC
  Filled 2019-01-10: qty 2

## 2019-01-10 MED ORDER — FENTANYL CITRATE (PF) 250 MCG/5ML IJ SOLN
INTRAMUSCULAR | Status: DC | PRN
  Administered 2019-01-10: 25 ug via INTRAVENOUS
  Administered 2019-01-10: 50 ug via INTRAVENOUS
  Administered 2019-01-10: 25 ug via INTRAVENOUS
  Administered 2019-01-10 (×2): 50 ug via INTRAVENOUS

## 2019-01-10 MED ORDER — 0.9 % SODIUM CHLORIDE (POUR BTL) OPTIME
TOPICAL | Status: DC | PRN
  Administered 2019-01-10: 1000 mL

## 2019-01-10 MED ORDER — PROPOFOL 10 MG/ML IV BOLUS
INTRAVENOUS | Status: AC
  Filled 2019-01-10: qty 20

## 2019-01-10 MED ORDER — FENTANYL CITRATE (PF) 250 MCG/5ML IJ SOLN
INTRAMUSCULAR | Status: AC
  Filled 2019-01-10: qty 5

## 2019-01-10 MED ORDER — PROPOFOL 10 MG/ML IV BOLUS
INTRAVENOUS | Status: DC | PRN
  Administered 2019-01-10: 10 mg via INTRAVENOUS
  Administered 2019-01-10: 200 mg via INTRAVENOUS
  Administered 2019-01-10 (×3): 10 mg via INTRAVENOUS

## 2019-01-10 MED ORDER — MEPERIDINE HCL 50 MG/ML IJ SOLN: 6.2500 mg | INTRAMUSCULAR | Status: DC | PRN

## 2019-01-10 MED ORDER — DEXAMETHASONE SODIUM PHOSPHATE 10 MG/ML IJ SOLN
INTRAMUSCULAR | Status: DC | PRN
  Administered 2019-01-10: 4 mg via INTRAVENOUS

## 2019-01-10 MED ORDER — FENTANYL CITRATE (PF) 100 MCG/2ML IJ SOLN
INTRAMUSCULAR | Status: AC
  Filled 2019-01-10: qty 2

## 2019-01-10 MED ORDER — HEPARIN SODIUM (PORCINE) 5000 UNIT/ML IJ SOLN: 5000.0000 [IU] | Freq: Once | INTRAMUSCULAR | Status: DC

## 2019-01-10 MED ORDER — LACTATED RINGERS IV SOLN
INTRAVENOUS | Status: DC
  Administered 2019-01-10 (×2): via INTRAVENOUS

## 2019-01-10 MED ORDER — IOPAMIDOL (ISOVUE-300) INJECTION 61%
INTRAVENOUS | Status: AC
  Filled 2019-01-10: qty 50

## 2019-01-10 MED ORDER — ONDANSETRON HCL 4 MG/2ML IJ SOLN
INTRAMUSCULAR | Status: DC | PRN
  Administered 2019-01-10: 4 mg via INTRAVENOUS

## 2019-01-10 MED ORDER — CELECOXIB 200 MG PO CAPS
200.0000 mg | ORAL_CAPSULE | ORAL | Status: AC
  Administered 2019-01-10: 200 mg via ORAL
  Filled 2019-01-10: qty 1

## 2019-01-10 MED ORDER — ROCURONIUM BROMIDE 10 MG/ML (PF) SYRINGE
PREFILLED_SYRINGE | INTRAVENOUS | Status: DC | PRN
  Administered 2019-01-10: 10 mg via INTRAVENOUS
  Administered 2019-01-10: 50 mg via INTRAVENOUS

## 2019-01-10 MED ORDER — HEPARIN SODIUM (PORCINE) 5000 UNIT/ML IJ SOLN
INTRAMUSCULAR | Status: AC
  Administered 2019-01-10: 5000 [IU]
  Filled 2019-01-10: qty 1

## 2019-01-10 MED ORDER — ACETAMINOPHEN 500 MG PO TABS
1000.0000 mg | ORAL_TABLET | ORAL | Status: AC
  Administered 2019-01-10: 1000 mg via ORAL
  Filled 2019-01-10: qty 2

## 2019-01-10 MED ORDER — SODIUM CHLORIDE 0.9 % IR SOLN
Status: DC | PRN
  Administered 2019-01-10: 1

## 2019-01-10 MED ORDER — SUGAMMADEX SODIUM 200 MG/2ML IV SOLN
INTRAVENOUS | Status: DC | PRN
  Administered 2019-01-10: 170 mg via INTRAVENOUS

## 2019-01-10 MED ORDER — ROCURONIUM BROMIDE 50 MG/5ML IV SOSY
PREFILLED_SYRINGE | INTRAVENOUS | Status: AC
  Filled 2019-01-10: qty 5

## 2019-01-10 MED ORDER — LIDOCAINE 2% (20 MG/ML) 5 ML SYRINGE
INTRAMUSCULAR | Status: DC | PRN
  Administered 2019-01-10: 100 mg via INTRAVENOUS

## 2019-01-10 MED ORDER — HYDROCODONE-ACETAMINOPHEN 5-325 MG PO TABS: 1.0000 | ORAL_TABLET | Freq: Four times a day (QID) | ORAL | 0 refills | Status: DC | PRN

## 2019-01-10 MED ORDER — DEXAMETHASONE SODIUM PHOSPHATE 10 MG/ML IJ SOLN
INTRAMUSCULAR | Status: AC
  Filled 2019-01-10: qty 1

## 2019-01-10 MED ORDER — ONDANSETRON HCL 4 MG/2ML IJ SOLN
INTRAMUSCULAR | Status: AC
  Filled 2019-01-10: qty 2

## 2019-01-10 MED ORDER — PHENYLEPHRINE 40 MCG/ML (10ML) SYRINGE FOR IV PUSH (FOR BLOOD PRESSURE SUPPORT)
PREFILLED_SYRINGE | INTRAVENOUS | Status: DC | PRN
  Administered 2019-01-10 (×2): 80 ug via INTRAVENOUS
  Administered 2019-01-10: 40 ug via INTRAVENOUS
  Administered 2019-01-10 (×2): 80 ug via INTRAVENOUS

## 2019-01-10 MED ORDER — FENTANYL CITRATE (PF) 100 MCG/2ML IJ SOLN
25.0000 ug | INTRAMUSCULAR | Status: DC | PRN
  Administered 2019-01-10 (×2): 50 ug via INTRAVENOUS

## 2019-01-10 SURGICAL SUPPLY — 43 items
ADH SKN CLS APL DERMABOND .7 (GAUZE/BANDAGES/DRESSINGS) ×1
APPLIER CLIP ROT 10 11.4 M/L (STAPLE) ×4
APR CLP MED LRG 11.4X10 (STAPLE) ×2
BAG SPEC RTRVL 10 TROC 200 (ENDOMECHANICALS)
BAG SPEC RTRVL LRG 6X4 10 (ENDOMECHANICALS)
BLADE CLIPPER SURG (BLADE) ×1 IMPLANT
CANISTER SUCT 3000ML PPV (MISCELLANEOUS) ×2 IMPLANT
CHLORAPREP W/TINT 26ML (MISCELLANEOUS) ×2 IMPLANT
CLIP APPLIE ROT 10 11.4 M/L (STAPLE) ×1 IMPLANT
COVER MAYO STAND STRL (DRAPES) ×2 IMPLANT
COVER SURGICAL LIGHT HANDLE (MISCELLANEOUS) ×2 IMPLANT
COVER WAND RF STERILE (DRAPES) ×1 IMPLANT
DERMABOND ADVANCED (GAUZE/BANDAGES/DRESSINGS) ×1
DERMABOND ADVANCED .7 DNX12 (GAUZE/BANDAGES/DRESSINGS) ×1 IMPLANT
DRAPE C-ARM 42X72 X-RAY (DRAPES) ×2 IMPLANT
ELECT REM PT RETURN 9FT ADLT (ELECTROSURGICAL) ×2
ELECTRODE REM PT RTRN 9FT ADLT (ELECTROSURGICAL) ×1 IMPLANT
GLOVE EUDERMIC 7 POWDERFREE (GLOVE) ×2 IMPLANT
GOWN STRL REUS W/ TWL LRG LVL3 (GOWN DISPOSABLE) ×2 IMPLANT
GOWN STRL REUS W/ TWL XL LVL3 (GOWN DISPOSABLE) ×1 IMPLANT
GOWN STRL REUS W/TWL LRG LVL3 (GOWN DISPOSABLE) ×6
GOWN STRL REUS W/TWL XL LVL3 (GOWN DISPOSABLE) ×2
KIT BASIN OR (CUSTOM PROCEDURE TRAY) ×2 IMPLANT
KIT TURNOVER KIT B (KITS) ×2 IMPLANT
NS IRRIG 1000ML POUR BTL (IV SOLUTION) ×2 IMPLANT
PAD ARMBOARD 7.5X6 YLW CONV (MISCELLANEOUS) ×2 IMPLANT
POUCH RETRIEVAL ECOSAC 10 (ENDOMECHANICALS) IMPLANT
POUCH RETRIEVAL ECOSAC 10MM (ENDOMECHANICALS)
POUCH SPECIMEN RETRIEVAL 10MM (ENDOMECHANICALS) IMPLANT
SCISSORS LAP 5X35 DISP (ENDOMECHANICALS) ×2 IMPLANT
SET CHOLANGIOGRAPH 5 50 .035 (SET/KITS/TRAYS/PACK) ×2 IMPLANT
SET IRRIG TUBING LAPAROSCOPIC (IRRIGATION / IRRIGATOR) ×2 IMPLANT
SET TUBE SMOKE EVAC HIGH FLOW (TUBING) ×2 IMPLANT
SLEEVE ENDOPATH XCEL 5M (ENDOMECHANICALS) ×2 IMPLANT
SPECIMEN JAR SMALL (MISCELLANEOUS) ×2 IMPLANT
SUT MNCRL AB 4-0 PS2 18 (SUTURE) ×3 IMPLANT
TOWEL OR 17X24 6PK STRL BLUE (TOWEL DISPOSABLE) ×2 IMPLANT
TOWEL OR 17X26 10 PK STRL BLUE (TOWEL DISPOSABLE) ×1 IMPLANT
TRAY LAPAROSCOPIC MC (CUSTOM PROCEDURE TRAY) ×2 IMPLANT
TROCAR XCEL BLUNT TIP 100MML (ENDOMECHANICALS) ×2 IMPLANT
TROCAR XCEL NON-BLD 11X100MML (ENDOMECHANICALS) ×2 IMPLANT
TROCAR XCEL NON-BLD 5MMX100MML (ENDOMECHANICALS) ×2 IMPLANT
WATER STERILE IRR 1000ML POUR (IV SOLUTION) ×2 IMPLANT

## 2019-01-10 NOTE — Interval H&P Note (Signed)
History and Physical Interval Note:  01/10/2019 7:28 AM  Jackson Smith  has presented today for surgery, with the diagnosis of GALLSTONES  The various methods of treatment have been discussed with the patient and family. After consideration of risks, benefits and other options for treatment, the patient has consented to  Procedure(s): LAPAROSCOPIC CHOLECYSTECTOMY WITH INTRAOPERATIVE CHOLANGIOGRAM (N/A) as a surgical intervention .  The patient's history has been reviewed, patient examined, no change in status, stable for surgery.  I have reviewed the patient's chart and labs.  Questions were answered to the patient's satisfaction.     Adin Hector

## 2019-01-10 NOTE — Anesthesia Postprocedure Evaluation (Signed)
Anesthesia Post Note  Patient: Jackson Smith  Procedure(s) Performed: LAPAROSCOPIC CHOLECYSTECTOMY WITH attempted INTRAOPERATIVE CHOLANGIOGRAM (N/A )     Patient location during evaluation: PACU Anesthesia Type: General Level of consciousness: awake and alert Pain management: pain level controlled Vital Signs Assessment: post-procedure vital signs reviewed and stable Respiratory status: spontaneous breathing, nonlabored ventilation, respiratory function stable and patient connected to nasal cannula oxygen Cardiovascular status: blood pressure returned to baseline and stable Postop Assessment: no apparent nausea or vomiting Anesthetic complications: no    Last Vitals:  Vitals:   01/10/19 0950 01/10/19 1005  BP: 122/74 119/74  Pulse: 74 86  Resp: 20 17  Temp:  (!) 36.4 C  SpO2: 99% 98%    Last Pain:  Vitals:   01/10/19 1005  TempSrc:   PainSc: 3                  Montez Hageman

## 2019-01-10 NOTE — Anesthesia Preprocedure Evaluation (Signed)
Anesthesia Evaluation  Patient identified by MRN, date of birth, ID band Patient awake    Reviewed: Allergy & Precautions, NPO status , Patient's Chart, lab work & pertinent test results  Airway Mallampati: II  TM Distance: >3 FB Neck ROM: Full    Dental no notable dental hx.    Pulmonary neg pulmonary ROS,    Pulmonary exam normal breath sounds clear to auscultation       Cardiovascular negative cardio ROS Normal cardiovascular exam Rhythm:Regular Rate:Normal     Neuro/Psych negative neurological ROS  negative psych ROS   GI/Hepatic negative GI ROS, Neg liver ROS,   Endo/Other  negative endocrine ROS  Renal/GU negative Renal ROS  negative genitourinary   Musculoskeletal negative musculoskeletal ROS (+)   Abdominal   Peds negative pediatric ROS (+)  Hematology Factor V Leiden   Anesthesia Other Findings   Reproductive/Obstetrics negative OB ROS                             Anesthesia Physical Anesthesia Plan  ASA: II  Anesthesia Plan: General   Post-op Pain Management:    Induction: Intravenous  PONV Risk Score and Plan: 2 and Ondansetron, Dexamethasone and Treatment may vary due to age or medical condition  Airway Management Planned: Oral ETT  Additional Equipment:   Intra-op Plan:   Post-operative Plan: Extubation in OR  Informed Consent: I have reviewed the patients History and Physical, chart, labs and discussed the procedure including the risks, benefits and alternatives for the proposed anesthesia with the patient or authorized representative who has indicated his/her understanding and acceptance.     Dental advisory given  Plan Discussed with: CRNA  Anesthesia Plan Comments:         Anesthesia Quick Evaluation

## 2019-01-10 NOTE — Anesthesia Procedure Notes (Signed)
Procedure Name: Intubation Date/Time: 01/10/2019 7:43 AM Performed by: Wilburn Cornelia, CRNA Pre-anesthesia Checklist: Patient identified, Emergency Drugs available, Suction available, Patient being monitored and Timeout performed Patient Re-evaluated:Patient Re-evaluated prior to induction Oxygen Delivery Method: Circle system utilized Preoxygenation: Pre-oxygenation with 100% oxygen Induction Type: IV induction and Cricoid Pressure applied Ventilation: Mask ventilation without difficulty Laryngoscope Size: Mac and 4 Grade View: Grade III Tube type: Oral Tube size: 7.5 mm Number of attempts: 1 Airway Equipment and Method: Stylet Placement Confirmation: ETT inserted through vocal cords under direct vision,  positive ETCO2,  CO2 detector and breath sounds checked- equal and bilateral Secured at: 22 cm Tube secured with: Tape Dental Injury: Teeth and Oropharynx as per pre-operative assessment

## 2019-01-10 NOTE — Discharge Instructions (Signed)
[facility] ______[facility] °LAPAROSCOPIC SURGERY: POST OP INSTRUCTIONS °Always review your discharge instruction sheet given to you by the facility where your surgery was performed. °IF YOU HAVE DISABILITY OR FAMILY LEAVE FORMS, YOU MUST BRING THEM TO THE OFFICE FOR PROCESSING.   °DO NOT GIVE THEM TO YOUR DOCTOR. ° °1. A prescription for pain medication may be given to you upon discharge.  Take your pain medication as prescribed, if needed.  If narcotic pain medicine is not needed, then you may take acetaminophen (Tylenol) or ibuprofen (Advil) as needed. °2. Take your usually prescribed medications unless otherwise directed. °3. If you need a refill on your pain medication, please contact your pharmacy.  They will contact our office to request authorization. Prescriptions will not be filled after 5pm or on week-ends. °4. You should follow a light diet the first few days after arrival home, such as soup and crackers, etc.  Be sure to include lots of fluids daily. °5. Most patients will experience some swelling and bruising in the area of the incisions.  Ice packs will help.  Swelling and bruising can take several days to resolve.  °6. It is common to experience some constipation if taking pain medication after surgery.  Increasing fluid intake and taking a stool softener (such as Colace) will usually help or prevent this problem from occurring.  A mild laxative (Milk of Magnesia or Miralax) should be taken according to package instructions if there are no bowel movements after 48 hours. °7. Unless discharge instructions indicate otherwise, you may remove your bandages 24-48 hours after surgery, and you may shower at that time.  You may have steri-strips (small skin tapes) in place directly over the incision.  These strips should be left on the skin for 7-10 days.  If your surgeon used skin glue on the incision, you may shower in 24 hours.  The glue will flake off over the next 2-3 weeks.  Any sutures or  staples will be removed at the office during your follow-up visit. °8. ACTIVITIES:  You may resume regular (light) daily activities beginning the next day--such as daily self-care, walking, climbing stairs--gradually increasing activities as tolerated.  You may have sexual intercourse when it is comfortable.  Refrain from any heavy lifting or straining until approved by your doctor. °a. You may drive when you are no longer taking prescription pain medication, you can comfortably wear a seatbelt, and you can safely maneuver your car and apply brakes. °b. RETURN TO WORK:  __________________________________________________________ °9. You should see your doctor in the office for a follow-up appointment approximately 2-3 weeks after your surgery.  Make sure that you call for this appointment within a day or two after you arrive home to insure a convenient appointment time. °10. OTHER INSTRUCTIONS: __________________________________________________________________________________________________________________________ __________________________________________________________________________________________________________________________ °WHEN TO CALL YOUR DOCTOR: °1. Fever over 101.0 °2. Inability to urinate °3. Continued bleeding from incision. °4. Increased pain, redness, or drainage from the incision. °5. Increasing abdominal pain ° °The clinic staff is available to answer your questions during regular business hours.  Please don’t hesitate to call and ask to speak to one of the nurses for clinical concerns.  If you have a medical emergency, go to the nearest emergency room or call 911.  A surgeon from [facility] is always on call at the hospital. °[address], [address], [place], [place]  [address] ? [address] [address], [place], [place]   [address] °[phone] ? [phone] ? FAX [phone] °Web site:   [email or web address] °

## 2019-01-10 NOTE — Transfer of Care (Signed)
Immediate Anesthesia Transfer of Care Note  Patient: Jackson Smith  Procedure(s) Performed: LAPAROSCOPIC CHOLECYSTECTOMY WITH attempted INTRAOPERATIVE CHOLANGIOGRAM (N/A )  Patient Location: PACU  Anesthesia Type:General  Level of Consciousness: awake, alert  and oriented  Airway & Oxygen Therapy: Patient Spontanous Breathing and Patient connected to nasal cannula oxygen  Post-op Assessment: Report given to RN and Post -op Vital signs reviewed and stable  Post vital signs: Reviewed and stable  Last Vitals:  Vitals Value Taken Time  BP 110/68 01/10/2019  9:21 AM  Temp    Pulse 66 01/10/2019  9:21 AM  Resp 12 01/10/2019  9:21 AM  SpO2 100 % 01/10/2019  9:21 AM  Vitals shown include unvalidated device data.  Last Pain:  Vitals:   01/10/19 0658  TempSrc:   PainSc: 0-No pain      Patients Stated Pain Goal: 4 (99/37/16 9678)  Complications: No apparent anesthesia complications

## 2019-01-10 NOTE — Op Note (Addendum)
Patient Name:           Jackson Smith   Date of Surgery:        01/10/2019  Pre op Diagnosis:      Chronic cholecystitis with cholelithiasis  Post op Diagnosis:    Same  Procedure:                 Laparoscopic cholecystectomy  Surgeon:                     Edsel Petrin. Dalbert Batman, M.D., FACS  Assistant:                      RNFA  Operative Indications:    This is a healthy 53 year old man, referred by Dr. Alysia Penna for evaluation of symptomatic gallstones.     He had a normal wellness visit in November was normal CBC and normal LFTs. In December he had a one-week episode of intermittent moderately severe epigastric pain radiating to the back. No real GI symptoms but he said he really didn't feel like eating. Did not think that meals exacerbated the pain. He's been better the last week or 2. Chronically constipated. No diarrhea. Ultrasound on December 04, 2018 shows multiple small gallstones. CBD 3.5 mm. No other abnormalities. Asymptomatic today.  Liver function test normal this week.     Past history of Factor V Leiden mutation.        I think that it is quite likely that he was having gallbladder attacks.  He was strongly motivated to go ahead and have his gallbladder out so that this would not happen again. That is reasonable and he will be scheduled for laparoscopic cholecystectomy with possible cholangiogram.   Operative Findings:       The gallbladder was thin-walled but was chronically inflamed.  There were confluent chronic adhesions from the fundus all the way down to the neck of the gallbladder.  These were taken down uneventfully.  There were a couple of tiny capsular tears of the liver which were easily controlled with cautery.  The anatomy of the cystic duct and cystic artery were conventional.  We isolated the cystic duct and created a large window behind that.  When we tried to cannulate the cystic duct for cholangiogram it was very tiny and fragile and tore into.  I  could not cannulated after that and so I abandoned the cholangiogram and controlled the cystic duct with multiple metal clips.  Four-quadrant inspection revealed some chronic adhesions in the right lower quadrant of uncertain etiology but there was no real evidence of visceral or peritoneal disease.  The liver looked healthy  Procedure in Detail:         He has a history of factor V Leiden mutation.  He was given subcutaneous heparin 2 hours preop.      following the induction of general endotracheal anesthesia the patient's abdomen was prepped and draped in a sterile fashion.  Intravenous antibiotics were given.  Surgical timeout was performed.  0.25% Marcaine with epinephrine was used as a local infiltration anesthetic.      A vertical incision was made at the lower rim of the umbilicus.  The fascia was incised in the midline and the abdominal cavity entered under direct vision.  An 11 mm Hassan trocar was inserted and secured with a pursestring suture of 0 Vicryl.  Pneumoperitoneum was created and camera was inserted.  A trocar was placed in the subxiphoid  region and 2 trochars placed in the right upper quadrant.  We could then identify the tip of the fundus and elevated that.  We slowly took down the adhesions with scissor cautery and blunt dissection.  We ultimately completely cleaned off the infundibulum and were able to retract that laterally.  After incising the peritoneum of the neck of the gallbladder we isolated the cystic duct and created a large window behind that.  Attempts to cannulate the cystic duct were unsuccessful as described above and the cystic duct was secured with multiple metal clips and divided.  The cystic artery was likewise isolated, secured with multiple metal clips and divided.  The gallbladder was dissected from its bed with electrocautery, placed in a specimen bag and removed.  The bed of the gallbladder was cauterized and a few spots that were bleeding.  The subhepatic and  subphrenic spaces were copiously irrigated.  The irrigation fluid became completely clear.  There is no evidence of bleeding or bile leak.     The pneumoperitoneum was released and the trochars were removed.  The fascia at the umbilicus was closed with 0 Vicryl sutures.  After irrigating the wounds the skin was closed with subcuticular 4-0 Monocryl and Dermabond.  Patient tolerated the procedure well and was taken to PACU in stable condition.  EBL less than 20 cc.  Counts correct.  Complications none.    Addendum: I logged onto the PMP aware website and reviewed his prescription medication history     Randolf Sansoucie M. Dalbert Batman, M.D., FACS General and Minimally Invasive Surgery Breast and Colorectal Surgery  01/10/2019 9:05 AM

## 2019-01-11 ENCOUNTER — Encounter (HOSPITAL_COMMUNITY): Payer: Self-pay | Admitting: General Surgery

## 2019-02-19 ENCOUNTER — Telehealth: Payer: Self-pay | Admitting: Family Medicine

## 2019-02-19 MED ORDER — CLOTRIMAZOLE-BETAMETHASONE 1-0.05 % EX CREA: 1.0000 "application " | TOPICAL_CREAM | Freq: Two times a day (BID) | CUTANEOUS | 2 refills | Status: DC

## 2019-02-19 NOTE — Telephone Encounter (Signed)
Called and spoke with pt and he is aware of new cream that has been sent to the pharmacy.

## 2019-02-19 NOTE — Telephone Encounter (Signed)
Dr. Fry please advise. Thanks  

## 2019-02-19 NOTE — Telephone Encounter (Signed)
Copied from [place] [phone]. Topic: Quick Communication - See Telephone Encounter >> Feb 19, 2019  1:24 PM Vernona Rieger wrote: CRM for notification. See Telephone encounter for: 02/19/19. Patient states triamcinolone cream (KENALOG) 0.1 % ( he think this is the name ) is no longer working for his rash on his lower left leg. He seen Dr Sarajane Jews for this back at his last appointment. He said does he need to see a dermatology or come back in and see Dr Sarajane Jews?

## 2019-02-19 NOTE — Telephone Encounter (Signed)
Stop the Triamcinolone and call in Lotrisone cream to apply bid as needed, 30 grams with 2 rf

## 2019-07-15 ENCOUNTER — Encounter: Payer: 59 | Admitting: Family Medicine

## 2019-07-21 ENCOUNTER — Ambulatory Visit (INDEPENDENT_AMBULATORY_CARE_PROVIDER_SITE_OTHER): Payer: 59 | Admitting: Family Medicine

## 2019-07-21 ENCOUNTER — Encounter: Payer: Self-pay | Admitting: Family Medicine

## 2019-07-21 VITALS — BP 110/80 | HR 75 | Temp 97.6°F | Wt 190.0 lb

## 2019-07-21 DIAGNOSIS — Z0000 Encounter for general adult medical examination without abnormal findings: Secondary | ICD-10-CM | POA: Diagnosis not present

## 2019-07-21 LAB — BASIC METABOLIC PANEL
BUN: 10 mg/dL (ref 6–23)
CO2: 28 mEq/L (ref 19–32)
Calcium: 9.5 mg/dL (ref 8.4–10.5)
Chloride: 101 mEq/L (ref 96–112)
Creatinine, Ser: 0.98 mg/dL (ref 0.40–1.50)
GFR: 80.09 mL/min (ref >60.00)
Glucose, Bld: 88 mg/dL (ref 70–99)
Potassium: 4.8 mEq/L (ref 3.5–5.1)
Sodium: 136 mEq/L (ref 135–145)

## 2019-07-21 LAB — PSA: PSA: 0.32 ng/mL (ref 0.10–4.00)

## 2019-07-21 LAB — HEPATIC FUNCTION PANEL
ALT: 19 U/L (ref 0–53)
AST: 19 U/L (ref 0–37)
Albumin: 4.6 g/dL (ref 3.5–5.2)
Alkaline Phosphatase: 79 U/L (ref 39–117)
Bilirubin, Direct: 0.1 mg/dL (ref 0.0–0.3)
Total Bilirubin: 0.5 mg/dL (ref 0.2–1.2)
Total Protein: 7.3 g/dL (ref 6.0–8.3)

## 2019-07-21 LAB — CBC WITH DIFFERENTIAL/PLATELET
Basophils Absolute: 0 10*3/uL (ref 0.0–0.1)
Basophils Relative: 0.8 % (ref 0.0–3.0)
Eosinophils Absolute: 0.2 10*3/uL (ref 0.0–0.7)
Eosinophils Relative: 3.3 % (ref 0.0–5.0)
HCT: 42.2 % (ref 39.0–52.0)
Hemoglobin: 14.3 g/dL (ref 13.0–17.0)
Lymphocytes Relative: 36.1 % (ref 12.0–46.0)
Lymphs Abs: 1.8 10*3/uL (ref 0.7–4.0)
MCHC: 33.9 g/dL (ref 30.0–36.0)
MCV: 88.4 fl (ref 78.0–100.0)
Monocytes Absolute: 0.5 10*3/uL (ref 0.1–1.0)
Monocytes Relative: 9.1 % (ref 3.0–12.0)
Neutro Abs: 2.6 10*3/uL (ref 1.4–7.7)
Neutrophils Relative %: 50.7 % (ref 43.0–77.0)
Platelets: 273 10*3/uL (ref 150.0–400.0)
RBC: 4.77 Mil/uL (ref 4.22–5.81)
RDW: 13.6 % (ref 11.5–15.5)
WBC: 5 10*3/uL (ref 4.0–10.5)

## 2019-07-21 LAB — LIPID PANEL
Cholesterol: 214 mg/dL — ABNORMAL HIGH (ref 0–200)
HDL: 46.3 mg/dL (ref >39.00)
LDL Cholesterol: 143 mg/dL — ABNORMAL HIGH (ref 0–99)
NonHDL: 168.15
Total CHOL/HDL Ratio: 5
Triglycerides: 127 mg/dL (ref 0.0–149.0)
VLDL: 25.4 mg/dL (ref 0.0–40.0)

## 2019-07-21 LAB — TSH: TSH: 1.67 u[IU]/mL (ref 0.35–4.50)

## 2019-07-21 NOTE — Progress Notes (Signed)
   Subjective:    Patient ID: Jackson Smith, male    DOB: 24-Nov-1966, 53 y.o.   MRN: [id number]  HPI Here for a well exam. He feels fine.    Review of Systems  Constitutional: Negative.   HENT: Negative.   Eyes: Negative.   Respiratory: Negative.   Cardiovascular: Negative.   Gastrointestinal: Negative.   Genitourinary: Negative.   Musculoskeletal: Negative.   Skin: Negative.   Neurological: Negative.   Psychiatric/Behavioral: Negative.        Objective:   Physical Exam Constitutional:      General: He is not in acute distress.    Appearance: He is well-developed. He is not diaphoretic.  HENT:     Head: Normocephalic and atraumatic.     Right Ear: External ear normal.     Left Ear: External ear normal.     Nose: Nose normal.     Mouth/Throat:     Pharynx: No oropharyngeal exudate.  Eyes:     General: No scleral icterus.       Right eye: No discharge.        Left eye: No discharge.     Conjunctiva/sclera: Conjunctivae normal.     Pupils: Pupils are equal, round, and reactive to light.  Neck:     Musculoskeletal: Neck supple.     Thyroid: No thyromegaly.     Vascular: No JVD.     Trachea: No tracheal deviation.  Cardiovascular:     Rate and Rhythm: Normal rate and regular rhythm.     Heart sounds: Normal heart sounds. No murmur. No friction rub. No gallop.   Pulmonary:     Effort: Pulmonary effort is normal. No respiratory distress.     Breath sounds: Normal breath sounds. No wheezing or rales.  Chest:     Chest wall: No tenderness.  Abdominal:     General: Bowel sounds are normal. There is no distension.     Palpations: Abdomen is soft. There is no mass.     Tenderness: There is no abdominal tenderness. There is no guarding or rebound.  Genitourinary:    Penis: Normal. No tenderness.      Scrotum/Testes: Normal.     Prostate: Normal.     Rectum: Normal. Guaiac result negative.     Comments: He has a left hydrocele as usual  Musculoskeletal: Normal  range of motion.        General: No tenderness.  Lymphadenopathy:     Cervical: No cervical adenopathy.  Skin:    General: Skin is warm and dry.     Coloration: Skin is not pale.     Findings: No erythema or rash.  Neurological:     Mental Status: He is alert and oriented to person, place, and time.     Cranial Nerves: No cranial nerve deficit.     Motor: No abnormal muscle tone.     Coordination: Coordination normal.     Deep Tendon Reflexes: Reflexes are normal and symmetric. Reflexes normal.  Psychiatric:        Behavior: Behavior normal.        Thought Content: Thought content normal.        Judgment: Judgment normal.           Assessment & Plan:  Well exam. We discussed diet and exercise. Get fasting labs.  Alysia Penna, MD

## 2020-01-05 ENCOUNTER — Other Ambulatory Visit: Payer: Self-pay

## 2020-01-05 ENCOUNTER — Ambulatory Visit (INDEPENDENT_AMBULATORY_CARE_PROVIDER_SITE_OTHER): Payer: 59 | Admitting: Family Medicine

## 2020-01-05 ENCOUNTER — Ambulatory Visit: Payer: Self-pay

## 2020-01-05 ENCOUNTER — Encounter: Payer: Self-pay | Admitting: Family Medicine

## 2020-01-05 VITALS — BP 112/76 | HR 69 | Temp 97.9°F | Ht 68.0 in | Wt 190.5 lb

## 2020-01-05 DIAGNOSIS — R079 Chest pain, unspecified: Secondary | ICD-10-CM | POA: Diagnosis not present

## 2020-01-05 NOTE — Telephone Encounter (Signed)
Patient has been scheduled with Dr. Elease Hashimoto. Will send as an [place]

## 2020-01-05 NOTE — Telephone Encounter (Signed)
Message Routed to PCP CMA 

## 2020-01-05 NOTE — Progress Notes (Signed)
Subjective:     Patient ID: Jackson Smith, male   DOB: 09/03/1966, 54 y.o.   MRN: [id number]  HPI   Jackson Smith is seen as a work in today with episode of atypical chest pain yesterday and then again today at work around 9:30 AM.  Pain was very brief and described as a stabbing pain lasting only seconds and left precordial region.  He states he felt his pain was more or less superficial and even seem to feel better with holding pressure locally.  Denies any pressure discomfort.  No dyspnea.  No nausea or vomiting.  No pleuritic pain.  No cough.  No radiation into the neck or arm.  He took over-the-counter omeprazole and is not sure if that helped.  He has not had any exertional symptoms recently  He states his dad did have coronary disease in his 21s but was diabetic.  [name] has history of mild elevated lipids.  Non-smoker.  Past Medical History:  Diagnosis Date  . Allergy    seasonal  . Anemia    low iron  . Anxiety    panic attack  . Cholecystitis with cholelithiasis 01/10/2019  . Constipation   . Factor V Leiden mutation ([employer]) 11/30/2012  . Frozen shoulder March 2015   right side, saw Dr. Fredna Dow, resolved with PT   . GERD (gastroesophageal reflux disease)   . Hemorrhoid    Past Surgical History:  Procedure Laterality Date  . ANKLE FRACTURE SURGERY    . CHOLECYSTECTOMY N/A 01/10/2019   Procedure: LAPAROSCOPIC CHOLECYSTECTOMY WITH attempted INTRAOPERATIVE CHOLANGIOGRAM;  Surgeon: Fanny Skates, MD;  Location: [facility];  Service: General;  Laterality: N/A;  . COLONOSCOPY  12/08/2016   per Dr. Havery Moros, benign polyps, repeat in 10 yrs   . GALLBLADDER SURGERY    . TESTICLE SURGERY  2010   decreased blood flow    . WISDOM TOOTH EXTRACTION    . wrist sprain     right    reports that he has never smoked. He has never used smokeless tobacco. He reports that he does not drink alcohol or use drugs. family history includes Birth defects in his brother; Cancer (age of onset: 26) in his  maternal uncle; Colon cancer in his maternal aunt; Colon cancer (age of onset: 15) in his maternal uncle; Diabetes in his father; Factor V Leiden deficiency in his brother, maternal aunt, and mother; Heart attack in his father. Allergies  Allergen Reactions  . Cashew Nut Oil Rash  . Penicillins Rash    Did it involve swelling of the face/tongue/throat, SOB, or low BP? No Did it involve sudden or severe rash/hives, skin peeling, or any reaction on the inside of your mouth or nose? No Did you need to seek medical attention at a hospital or doctor's office? No When did it last happen?childhood allergy If all above answers are "NO", may proceed with cephalosporin use.    The 10-year ASCVD risk score Mikey Bussing DC Brooke Bonito., et al., 2013) is: 4.3%   Values used to calculate the score:     Age: 40 years     Sex: Male     Is Non-Hispanic African American: No     Diabetic: No     Tobacco smoker: No     Systolic Blood Pressure: XX123456 mmHg     Is BP treated: No     HDL Cholesterol: 46.3 mg/dL     Total Cholesterol: 214 mg/dL   Review of Systems  Constitutional: Negative for appetite change,  chills, fatigue, fever and unexpected weight change.  Eyes: Negative for visual disturbance.  Respiratory: Negative for cough, chest tightness, shortness of breath and wheezing.   Cardiovascular: Positive for chest pain. Negative for palpitations and leg swelling.  Neurological: Negative for dizziness, syncope, weakness, light-headedness and headaches.       Objective:   Physical Exam Constitutional:      Appearance: He is well-developed.  HENT:     Right Ear: External ear normal.     Left Ear: External ear normal.  Eyes:     Pupils: Pupils are equal, round, and reactive to light.  Neck:     Thyroid: No thyromegaly.  Cardiovascular:     Rate and Rhythm: Normal rate and regular rhythm.  Pulmonary:     Effort: Pulmonary effort is normal. No respiratory distress.     Breath sounds: Normal breath  sounds. No wheezing or rales.  Musculoskeletal:     Cervical back: Neck supple.  Skin:    Findings: No rash.  Neurological:     Mental Status: He is alert and oriented to person, place, and time.        Assessment:     Patient is seen with 2 episodes of atypical chest pain.  He describes sharp quality at rest lasting only seconds.    Plan:     -Obtain EKG-normal sinus rhythm with no acute ST-T changes -We discussed signs and symptoms to watch out for for angina.  He has very atypical symptoms including the quality and brief duration and the fact that these have been nonexertional. -He knows to call 911 or go to ER for any severe chest pain and let us know if he continues to have recurrent atypical symptoms  Eulas Post MD [facility]

## 2020-01-05 NOTE — Patient Instructions (Signed)

## 2020-01-05 NOTE — Telephone Encounter (Signed)
Patient called stating that he has chest pain that started yesterday. He states it is a stabbing pain that comes and goes lasting only a few seconds to a minute. He rates the pain as mild. He denies other cardiac symptoms. She states that once he thought he saw spots in his eyes. He denies that the pain radiates. His father had heart Dx. Care advice read to patient. He verbalized understanding. Call transferred to office for scheduling.  Reason for Disposition . [1] Chest pain lasting < 5 minutes AND [2] NO chest pain or cardiac symptoms (e.g., breathing difficulty, sweating) now  Answer Assessment - Initial Assessment Questions 1. LOCATION: "Where does it hurt?"       Left chest breast area 2. RADIATION: "Does the pain go anywhere else?" (e.g., into neck, jaw, arms, back)   no 3. ONSET: "When did the chest pain begin?" (Minutes, hours or days)      yesterday 4. PATTERN "Does the pain come and go, or has it been constant since it started?"  "Does it get worse with exertion?"     Comes and goes 5. DURATION: "How long does it last" (e.g., seconds, minutes, hours)     fwe seconds to a minute 6. SEVERITY: "How bad is the pain?"  (e.g., Scale 1-10; mild, moderate, or severe)    - MILD (1-3): doesn't interfere with normal activities     - MODERATE (4-7): interferes with normal activities or awakens from sleep    - SEVERE (8-10): excruciating pain, unable to do any normal activities      mild 7. CARDIAC RISK FACTORS: "Do you have any history of heart problems or risk factors for heart disease?" (e.g., angina, prior heart attack; diabetes, high blood pressure, high cholesterol, smoker, or strong family history of heart disease)   dad 25. PULMONARY RISK FACTORS: "Do you have any history of lung disease?"  (e.g., blood clots in lung, asthma, emphysema, birth control pills)     no 9. CAUSE: "What do you think is causing the chest pain?"     unsure 10. OTHER SYMPTOMS: "Do you have any other symptoms?"  (e.g., dizziness, nausea, vomiting, sweating, fever, difficulty breathing, cough)      No spot in front of eyes last night 11. PREGNANCY: "Is there any chance you are pregnant?" "When was your last menstrual period?"       N/A  Protocols used: CHEST PAIN-A-AH

## 2020-02-10 ENCOUNTER — Encounter: Payer: Self-pay | Admitting: Family Medicine

## 2020-02-10 ENCOUNTER — Ambulatory Visit (INDEPENDENT_AMBULATORY_CARE_PROVIDER_SITE_OTHER): Payer: 59 | Admitting: Family Medicine

## 2020-02-10 ENCOUNTER — Other Ambulatory Visit: Payer: Self-pay

## 2020-02-10 DIAGNOSIS — R1030 Lower abdominal pain, unspecified: Secondary | ICD-10-CM

## 2020-02-10 MED ORDER — DOXYCYCLINE HYCLATE 100 MG PO CAPS: 100.0000 mg | ORAL_CAPSULE | Freq: Two times a day (BID) | ORAL | 0 refills | Status: AC

## 2020-02-10 NOTE — Progress Notes (Signed)
   Subjective:    Patient ID: Jackson Smith, male    DOB: 30-Jun-1966, 54 y.o.   MRN: [id number]  HPI Virtual Visit via Telephone Note  I connected with the patient on 02/10/20 at  4:15 PM EST by telephone and verified that I am speaking with the correct person using two identifiers.   I discussed the limitations, risks, security and privacy concerns of performing an evaluation and management service by telephone and the availability of in person appointments. I also discussed with the patient that there may be a patient responsible charge related to this service. The patient expressed understanding and agreed to proceed.  Location patient: home Location provider: work or home office Participants present for the call: patient, provider Patient did not have a visit in the prior 7 days to address this/these issue(s).   History of Present Illness: Here to discuss a mild but steady pain in the lower abdomen that started about 2 weeks ago. This located below the belly button and above the pubis. No fever. He has felt nauseated a few times but has not vomited. No fever. His appetite is normal. His BMs are normal . No change in urinations. He cannot make the pain worse by taking a deep breath or by twisting his body. He typically drinks a lot of water every day.    Observations/Objective: Patient sounds cheerful and well on the phone. I do not appreciate any SOB. Speech and thought processing are grossly intact. Patient reported vitals:  Assessment and Plan: This sounds like a UTI, possibly a prostatitis. Treat with Doxycycline for 10 days. If he is not better by the first of next week, he is to come into the clinic for an in person exam, lab work, etc.  Alysia Penna, MD   Follow Up Instructions:     [phone] 5-10 [phone] 11-20 [id number] 21-30 I did not refer this patient for an OV in the next 24 hours for this/these issue(s).  I discussed the assessment and treatment plan with the  patient. The patient was provided an opportunity to ask questions and all were answered. The patient agreed with the plan and demonstrated an understanding of the instructions.   The patient was advised to call back or seek an in-person evaluation if the symptoms worsen or if the condition fails to improve as anticipated.  I provided 14 minutes of non-face-to-face time during this encounter.   Alysia Penna, MD    Review of Systems     Objective:   Physical Exam        Assessment & Plan:

## 2020-02-12 ENCOUNTER — Ambulatory Visit: Payer: 59 | Admitting: Family Medicine

## 2020-03-06 ENCOUNTER — Ambulatory Visit: Payer: 59

## 2020-03-26 ENCOUNTER — Encounter: Payer: Self-pay | Admitting: Family Medicine

## 2020-03-26 ENCOUNTER — Other Ambulatory Visit: Payer: Self-pay

## 2020-03-26 ENCOUNTER — Ambulatory Visit (INDEPENDENT_AMBULATORY_CARE_PROVIDER_SITE_OTHER): Payer: 59 | Admitting: Family Medicine

## 2020-03-26 VITALS — BP 120/100 | HR 72 | Temp 97.9°F | Wt 193.2 lb

## 2020-03-26 DIAGNOSIS — R1030 Lower abdominal pain, unspecified: Secondary | ICD-10-CM

## 2020-03-26 LAB — POCT URINALYSIS DIPSTICK
Bilirubin, UA: NEGATIVE
Blood, UA: NEGATIVE
Glucose, UA: NEGATIVE
Ketones, UA: NEGATIVE
Leukocytes, UA: NEGATIVE
Nitrite, UA: NEGATIVE
Protein, UA: NEGATIVE
Spec Grav, UA: 1.02 (ref 1.010–1.025)
Urobilinogen, UA: 0.2 E.U./dL
pH, UA: 5 (ref 5.0–8.0)

## 2020-03-26 MED ORDER — CIPROFLOXACIN HCL 500 MG PO TABS: 500.0000 mg | ORAL_TABLET | Freq: Two times a day (BID) | ORAL | 0 refills | Status: DC

## 2020-03-26 MED ORDER — ALPRAZOLAM 0.5 MG PO TABS: 0.5000 mg | ORAL_TABLET | Freq: Two times a day (BID) | ORAL | 5 refills | Status: DC | PRN

## 2020-03-26 NOTE — Progress Notes (Signed)
   Subjective:    Patient ID: Jackson Smith, male    DOB: 06-Jan-1966, 54 y.o.   MRN: [id number]  HPI Here for 2 months of intermittent mild lower abdominal pains. These are usually in the central lower abdomen but sometimes these can involve the RLQ. He feels increased urgency and frequency of urinations. No burning. No fever or anorexia. He has slight nausea at times. His BMs are regular. He uses metamucil daily. We had a virtual visit about this on 02-10-20, and we gave him Doxycycline. This helped a little, but it did not last long.    Review of Systems  Constitutional: Negative.   Respiratory: Negative.   Cardiovascular: Negative.   Gastrointestinal: Positive for abdominal pain and nausea. Negative for abdominal distention, anal bleeding, blood in stool, constipation, diarrhea, rectal pain and vomiting.  Genitourinary: Positive for frequency and urgency. Negative for discharge, dysuria, flank pain and hematuria.       Objective:   Physical Exam Constitutional:      Appearance: Normal appearance. He is well-developed. He is not ill-appearing.  Cardiovascular:     Rate and Rhythm: Normal rate and regular rhythm.     Pulses: Normal pulses.     Heart sounds: Normal heart sounds.  Pulmonary:     Effort: Pulmonary effort is normal.     Breath sounds: Normal breath sounds.  Abdominal:     General: Abdomen is flat. Bowel sounds are normal. There is no distension.     Palpations: Abdomen is soft. There is no mass.     Tenderness: There is no guarding or rebound.     Hernia: No hernia is present.     Comments: Mild right lower and central lower abdominal tenderness   Genitourinary:    Testes: Normal.     Comments: Prostate is mildly enlarged and very tender  Neurological:     Mental Status: He is alert.           Assessment & Plan:  Prostatitis, treat with Cipro. Culture the sample.  Alysia Penna, MD

## 2020-03-28 LAB — URINE CULTURE
MICRO NUMBER:: 10346420
Result:: NO GROWTH
SPECIMEN QUALITY:: ADEQUATE

## 2020-03-31 ENCOUNTER — Telehealth: Payer: Self-pay | Admitting: Family Medicine

## 2020-03-31 NOTE — Telephone Encounter (Signed)
Pt call and want to know if he can take his covid  Shot because he is taken ciprofloxacin (CIPRO) 500 MG tablet want a call back.

## 2020-04-01 NOTE — Telephone Encounter (Signed)
It would be better to postpone the Covid vaccine until after the prostate infection clears up

## 2020-04-02 NOTE — Telephone Encounter (Signed)
Actually I have changed my mind. Go ahead and get the vaccine as scheduled

## 2020-04-14 ENCOUNTER — Encounter: Payer: Self-pay | Admitting: Family Medicine

## 2020-04-14 ENCOUNTER — Ambulatory Visit (INDEPENDENT_AMBULATORY_CARE_PROVIDER_SITE_OTHER): Payer: 59 | Admitting: Family Medicine

## 2020-04-14 ENCOUNTER — Other Ambulatory Visit: Payer: Self-pay

## 2020-04-14 VITALS — BP 122/64 | HR 85 | Temp 98.1°F | Wt 191.2 lb

## 2020-04-14 DIAGNOSIS — R1031 Right lower quadrant pain: Secondary | ICD-10-CM | POA: Diagnosis not present

## 2020-04-14 NOTE — Progress Notes (Signed)
   Subjective:    Patient ID: Jackson Smith, male    DOB: [birth year], 54 y.o.   MRN: [id number]  HPI Here for an intermittent pain in the RLQ which started about 2 and 1/2 months ago. This is sometimes sharp and sometimes dull, it comes and goes. Sometimes he can bend over at the waist and make the pain worse. He often has urinary urgency and frequency, but there is no burning and no blood in the urine. No fever or nausea. His appetite is normal. Eating does not affect the pain. His BMs are normal. He uses Metamucil and drinks water daily. We saw him for this on 03-26-20 and we treated him with Doxycycline. He did not improve so we then gave him a course of Cipro, but this did not help either. A urine culture was negative.    Review of Systems  Constitutional: Negative.   Respiratory: Negative.   Cardiovascular: Negative.   Gastrointestinal: Positive for abdominal pain. Negative for abdominal distention, anal bleeding, blood in stool, constipation, diarrhea, nausea, rectal pain and vomiting.  Genitourinary: Positive for frequency and urgency. Negative for difficulty urinating, dysuria, flank pain, hematuria and testicular pain.       Objective:   Physical Exam Constitutional:      Appearance: Normal appearance. He is not ill-appearing.  Cardiovascular:     Rate and Rhythm: Normal rate and regular rhythm.     Pulses: Normal pulses.     Heart sounds: Normal heart sounds.  Pulmonary:     Effort: Pulmonary effort is normal.     Breath sounds: Normal breath sounds.  Abdominal:     General: Abdomen is flat. Bowel sounds are normal. There is no distension.     Palpations: Abdomen is soft. There is no mass.     Tenderness: There is no guarding or rebound.     Hernia: No hernia is present.     Comments: Mildly tender in the RLQ   Neurological:     Mental Status: He is alert.           Assessment & Plan:  RLQ pain. We will get labs today and set up a contrasted CT of the  abdomen and pelvis soon.  Alysia Penna, MD

## 2020-04-15 LAB — HEPATIC FUNCTION PANEL
ALT: 23 U/L (ref 0–53)
AST: 23 U/L (ref 0–37)
Albumin: 4.4 g/dL (ref 3.5–5.2)
Alkaline Phosphatase: 78 U/L (ref 39–117)
Bilirubin, Direct: 0.1 mg/dL (ref 0.0–0.3)
Total Bilirubin: 0.4 mg/dL (ref 0.2–1.2)
Total Protein: 7.5 g/dL (ref 6.0–8.3)

## 2020-04-15 LAB — BASIC METABOLIC PANEL
BUN: 13 mg/dL (ref 6–23)
CO2: 29 mEq/L (ref 19–32)
Calcium: 9.4 mg/dL (ref 8.4–10.5)
Chloride: 98 mEq/L (ref 96–112)
Creatinine, Ser: 1.02 mg/dL (ref 0.40–1.50)
GFR: 76.27 mL/min (ref >60.00)
Glucose, Bld: 63 mg/dL — ABNORMAL LOW (ref 70–99)
Potassium: 4.3 mEq/L (ref 3.5–5.1)
Sodium: 134 mEq/L — ABNORMAL LOW (ref 135–145)

## 2020-04-15 LAB — CBC WITH DIFFERENTIAL/PLATELET
Basophils Absolute: 0 10*3/uL (ref 0.0–0.1)
Basophils Relative: 0.7 % (ref 0.0–3.0)
Eosinophils Absolute: 0.2 10*3/uL (ref 0.0–0.7)
Eosinophils Relative: 2.9 % (ref 0.0–5.0)
HCT: 42.3 % (ref 39.0–52.0)
Hemoglobin: 13.9 g/dL (ref 13.0–17.0)
Lymphocytes Relative: 32.6 % (ref 12.0–46.0)
Lymphs Abs: 2.2 10*3/uL (ref 0.7–4.0)
MCHC: 32.9 g/dL (ref 30.0–36.0)
MCV: 88.9 fl (ref 78.0–100.0)
Monocytes Absolute: 0.6 10*3/uL (ref 0.1–1.0)
Monocytes Relative: 9.1 % (ref 3.0–12.0)
Neutro Abs: 3.8 10*3/uL (ref 1.4–7.7)
Neutrophils Relative %: 54.7 % (ref 43.0–77.0)
Platelets: 298 10*3/uL (ref 150.0–400.0)
RBC: 4.76 Mil/uL (ref 4.22–5.81)
RDW: 14.4 % (ref 11.5–15.5)
WBC: 6.9 10*3/uL (ref 4.0–10.5)

## 2020-04-30 ENCOUNTER — Ambulatory Visit
Admission: RE | Admit: 2020-04-30 | Discharge: 2020-04-30 | Disposition: A | Discharge status: Home / Self Care | Payer: 59 | Source: Ambulatory Visit | Attending: Family Medicine | Admitting: Family Medicine

## 2020-04-30 DIAGNOSIS — R1031 Right lower quadrant pain: Secondary | ICD-10-CM

## 2020-04-30 MED ORDER — IOPAMIDOL (ISOVUE-300) INJECTION 61%
100.0000 mL | Freq: Once | INTRAVENOUS | Status: AC | PRN
  Administered 2020-04-30: 100 mL via INTRAVENOUS

## 2020-05-05 ENCOUNTER — Telehealth: Payer: Self-pay | Admitting: Family Medicine

## 2020-05-05 NOTE — Addendum Note (Signed)
Addended by: Alysia Penna A on: 05/05/2020 04:09 PM   Modules accepted: Orders

## 2020-05-05 NOTE — Telephone Encounter (Signed)
I just did the referral to GI

## 2020-05-05 NOTE — Telephone Encounter (Signed)
I do not see any orders for this. Please advise.

## 2020-05-05 NOTE — Telephone Encounter (Signed)
Pt is requesting to speak to Dr. Sarajane Jews, regarding a referral to a GI specialist. Per pt, he had mentioned a referral last time he was in the office. Please call pt after 3:30 p.m. Thanks

## 2020-05-06 ENCOUNTER — Encounter: Payer: Self-pay | Admitting: Nurse Practitioner

## 2020-05-06 NOTE — Telephone Encounter (Signed)
Left a detailed message on verified voice mail.   

## 2020-05-27 ENCOUNTER — Ambulatory Visit (INDEPENDENT_AMBULATORY_CARE_PROVIDER_SITE_OTHER): Payer: 59 | Admitting: Nurse Practitioner

## 2020-05-27 ENCOUNTER — Encounter: Payer: Self-pay | Admitting: Nurse Practitioner

## 2020-05-27 VITALS — BP 118/70 | HR 72 | Ht 69.0 in | Wt 192.4 lb

## 2020-05-27 DIAGNOSIS — R1031 Right lower quadrant pain: Secondary | ICD-10-CM

## 2020-05-27 DIAGNOSIS — K5900 Constipation, unspecified: Secondary | ICD-10-CM | POA: Diagnosis not present

## 2020-05-27 MED ORDER — LUBIPROSTONE 8 MCG PO CAPS
8.0000 ug | ORAL_CAPSULE | Freq: Two times a day (BID) | ORAL | 3 refills | Status: DC
Start: 2020-05-27 — End: 2020-07-05

## 2020-05-27 NOTE — Progress Notes (Signed)
Agree with assessment and plan as outlined.  

## 2020-05-27 NOTE — Progress Notes (Signed)
ASSESSMENT / PLAN:   54 year old male with history of Factor V Leiden, cholecystectomy  # RLQ discomfort ( "weird" sensation) /generalized abdominal discomfort --present for 2-3 months --Labs and CT scan unrevealing --Due to the migrating nature of the abdominal discomfort query is pain is functional in nature.  --We need to take constipation out of the equation before proceeding with any further workup.  See below. --Patient is anxious about diagnostic possibilities  His uncle died with what sounds like a ruptured abdominal aneurysm.  Grandfather had some problems with stool impaction at death.  Patient concerned about history of  factor V Leiden possibility blood clot causing sensations in abdomen.   We reviewed his contrasted CT scan of the abd/ pelvis together. I tried to reassure him. --Further work-up pending response to treatment of constipation --Follow-up 6 to 8 weeks with Dr. Havery Moros.   # Constipation --Patient makes effort to drink 3-4 bottles of water every morning to "get things going". MIralax and Metamucil poorly tolerated due to bloating.  Currently taking milk of magnesia about once a week -- Trial of Amitza 8 mcg twice daily, continue with good hydration  # Colon cancer screening --He is up-to-date on colonoscopy, last one in 2017 due for 10-year recall 2027  # Factor V Leiden Deficiency   HPI:     Chief Complaint:  Abdominal pain   Jackson Smith is a 54 yo male known to Dr. Havery Moros from screening colonoscopy in 2017.  Patient is here for evaluation of abdominal discomfort.  Over the last 2 to 3 months he has had a" weird sensation" in his RLQ. Often feels like there is a bubble inside. The sensation is more noticeable when bending over,  better when lying down. In addition to the RLQ discomfort, patient frequently has pain in his mid and upper abdomen and in his lower back as well.    His bowel habits vary.  Sometimes stools are hard  leading to straining, other times stools are normal in consistency.    He has tried with Metamucil and MiraLAX but they caused bloating. In an effort to manage constipation he takes milk of magnesia about once a week.  Every day he is diligent in drinking 3-4 bottles of water to " get things going" but this actually causes him to have frequent urination which can be a problem at work.  He has noticed that some improvement in abdominal discomfort after taking MOM and having loose stool.  Because of urinary frequency/urgency ( ? And maybe RLQ discomfort)  PCP treated him with doxycycline followed by Cipro with no improvement in symptoms.  Urine culture was negative.  CT scan of the abdomen and pelvis with contrast 04/30/2020 was unrevealing except for liver cysts. He is not having any blood in his stool, no unusual weight loss.  He has had some occasional nausea but appetite is okay.  His uncle died of what sounds like a ruptured abdominal aneurysm.  Patient is concerned about possibility of having a blood clot somewhere.  His grandfather had a fecal impaction at death and so patient is worried about the possibility of that.  It sounds like his uncle died of a ruptured abdominal aneurysm and patient worried that a blood clot could be causing some of his abdominal symptoms.   Data Reviewed:   04/14/20 CMP unremarkable CBC unremarkable  05/01/20 CT scan  abd/ pelvis with contrast IMPRESSION: 1. No acute  findings within the abdomen or pelvis. No explanation for patient's right lower quadrant pain. 2. Liver cysts   Past Medical History:  Diagnosis Date   Allergy    seasonal   Anemia    low iron   Anxiety    panic attack   Cholecystitis with cholelithiasis 01/10/2019   Constipation    Factor V Leiden mutation ([place]) 11/30/2012   Frozen shoulder March 2015   right side, saw Dr. Fredna Dow, resolved with PT    GERD (gastroesophageal reflux disease)    Hemorrhoid      Past Surgical History:    Procedure Laterality Date   ANKLE FRACTURE SURGERY     CHOLECYSTECTOMY N/A 01/10/2019   Procedure: LAPAROSCOPIC CHOLECYSTECTOMY WITH attempted INTRAOPERATIVE CHOLANGIOGRAM;  Surgeon: Fanny Skates, MD;  Location: [facility];  Service: General;  Laterality: N/A;   COLONOSCOPY  12/08/2016   per Dr. Havery Moros, benign polyps, repeat in 8 yrs    [place]  2010   decreased blood flow     WISDOM TOOTH EXTRACTION     wrist sprain     right   Family History  Problem Relation Age of Onset   Diabetes Father    Heart attack Father    Kidney failure Father    Birth defects Brother    Factor V Leiden deficiency Brother    Factor V Leiden deficiency Mother    Breast cancer Sister    Factor V Leiden deficiency Maternal Aunt    Colon cancer Maternal Aunt    Cancer Maternal Uncle 60       colon cancer   Colon cancer Maternal Uncle 60       60's   Social History   Tobacco Use   Smoking status: Never Smoker   Smokeless tobacco: Never Used  Vaping Use   Vaping Use: Never used  Substance Use Topics   Alcohol use: No    Alcohol/week: 0.0 standard drinks   Drug use: No   Current Outpatient Medications  Medication Sig Dispense Refill   ALPRAZolam (XANAX) 0.5 MG tablet Take 1 tablet (0.5 mg total) by mouth 2 (two) times daily as needed for anxiety. 60 tablet 5   fexofenadine-pseudoephedrine (ALLEGRA-D 24) 180-240 MG per 24 hr tablet Take 1 tablet by mouth daily as needed (allergies).      magnesium hydroxide (MILK OF MAGNESIA) 400 MG/5ML suspension Take 15 mLs by mouth daily as needed for mild constipation.     omeprazole (PRILOSEC) 20 MG capsule Take 1 capsule (20 mg total) by mouth daily. (Patient taking differently: Take 20 mg by mouth as needed (acid reflux). ) 30 capsule 0   No current facility-administered medications for this visit.   Allergies  Allergen Reactions   Cashew Nut Oil Rash   Penicillins Rash    Did it involve  swelling of the face/tongue/throat, SOB, or low BP? No Did it involve sudden or severe rash/hives, skin peeling, or any reaction on the inside of your mouth or nose? No Did you need to seek medical attention at a hospital or doctor's office? No When did it last happen?childhood allergy If all above answers are NO, may proceed with cephalosporin use.      Review of Systems: Positive for allergy, sinus trouble, excessive urination.  All other systems reviewed and negative except where noted in HPI.   Creatinine clearance cannot be calculated (Patient's most recent lab result is older than the maximum 21 days allowed.)  Physical Exam:    Wt Readings from Last 3 Encounters:  05/27/20 192 lb 6 oz (87.3 kg)  04/14/20 191 lb 3.2 oz (86.7 kg)  03/26/20 193 lb 3.2 oz (87.6 kg)    BP 118/70    Pulse 72    Ht 5\' 9"  (1.753 m)    Wt 192 lb 6 oz (87.3 kg)    BMI 28.41 kg/m  Constitutional:  Pleasant male in no acute distress. Psychiatric: Normal mood and affect. Behavior is normal. EENT: Pupils normal.  Conjunctivae are normal. No scleral icterus. Neck supple.  Cardiovascular: Normal rate, regular rhythm. No edema Pulmonary/chest: Effort normal and breath sounds normal. No wheezing, rales or rhonchi. Abdominal: Soft, nondistended, nontender. Bowel sounds active throughout. There are no masses palpable. No hepatomegaly. Neurological: Alert and oriented to person place and time. Skin: Skin is warm and dry. No rashes noted.  Tye Savoy, NP  05/27/2020, 8:39 AM  Cc:  Referring Provider Laurey Morale, MD

## 2020-05-27 NOTE — Patient Instructions (Signed)
If you are age 54 or older, your body mass index should be between 23-30. Your Body mass index is 28.41 kg/m. If this is out of the aforementioned range listed, please consider follow up with your Primary Care Provider.  If you are age 110 or younger, your body mass index should be between 19-25. Your Body mass index is 28.41 kg/m. If this is out of the aformentioned range listed, please consider follow up with your Primary Care Provider.   We have sent the following medications to your pharmacy for you to pick up at your convenience: Amitiza 8 mg twice daily.  Drink at least 64 ounces of water daily.   Follow up with Dr. Havery Moros.

## 2020-05-28 ENCOUNTER — Telehealth: Payer: Self-pay | Admitting: Nurse Practitioner

## 2020-05-28 NOTE — Telephone Encounter (Signed)
Pt was prescribed Amitiza but stated that insurance prefers Linzess.

## 2020-06-01 NOTE — Telephone Encounter (Signed)
Jackson Smith, you can call in the Linzess 72 mcg daily on an empty stomach.  Please give him #30 with 2 refills.  Let him know that Linzess can sometimes give people diarrhea, especially in the beginning. Thanks

## 2020-06-01 NOTE — Telephone Encounter (Signed)
Patient insurance prefers Linzess. Please advise.

## 2020-06-02 MED ORDER — LINACLOTIDE 72 MCG PO CAPS: 72.0000 ug | ORAL_CAPSULE | Freq: Every day | ORAL | 2 refills | Status: DC

## 2020-06-02 NOTE — Telephone Encounter (Signed)
Left message letting patient know new script sent to pharmacy.

## 2020-07-05 ENCOUNTER — Encounter (HOSPITAL_COMMUNITY): Payer: Self-pay | Admitting: Emergency Medicine

## 2020-07-05 ENCOUNTER — Emergency Department (HOSPITAL_COMMUNITY): Payer: 59

## 2020-07-05 ENCOUNTER — Telehealth: Payer: Self-pay | Admitting: Family Medicine

## 2020-07-05 ENCOUNTER — Other Ambulatory Visit: Payer: Self-pay

## 2020-07-05 ENCOUNTER — Emergency Department (HOSPITAL_COMMUNITY)
Admission: EM | Admit: 2020-07-05 | Discharge: 2020-07-06 | Disposition: A | Discharge status: Home / Self Care | Payer: 59 | Attending: Emergency Medicine | Admitting: Emergency Medicine

## 2020-07-05 DIAGNOSIS — Z79899 Other long term (current) drug therapy: Secondary | ICD-10-CM | POA: Insufficient documentation

## 2020-07-05 DIAGNOSIS — R0789 Other chest pain: Secondary | ICD-10-CM | POA: Diagnosis not present

## 2020-07-05 DIAGNOSIS — R079 Chest pain, unspecified: Secondary | ICD-10-CM | POA: Diagnosis present

## 2020-07-05 LAB — BASIC METABOLIC PANEL
Anion gap: 9 (ref 5–15)
BUN: 14 mg/dL (ref 6–20)
CO2: 25 mmol/L (ref 22–32)
Calcium: 9.2 mg/dL (ref 8.9–10.3)
Chloride: 103 mmol/L (ref 98–111)
Creatinine, Ser: 1.04 mg/dL (ref 0.61–1.24)
GFR calc Af Amer: >60 mL/min (ref >60)
GFR calc non Af Amer: >60 mL/min (ref >60)
Glucose, Bld: 95 mg/dL (ref 70–99)
Potassium: 4 mmol/L (ref 3.5–5.1)
Sodium: 137 mmol/L (ref 135–145)

## 2020-07-05 LAB — CBC
HCT: 41.9 % (ref 39.0–52.0)
Hemoglobin: 13.6 g/dL (ref 13.0–17.0)
MCH: 29.1 pg (ref 26.0–34.0)
MCHC: 32.5 g/dL (ref 30.0–36.0)
MCV: 89.7 fL (ref 80.0–100.0)
Platelets: 309 10*3/uL (ref 150–400)
RBC: 4.67 MIL/uL (ref 4.22–5.81)
RDW: 13.2 % (ref 11.5–15.5)
WBC: 5.3 10*3/uL (ref 4.0–10.5)
nRBC: 0 % (ref 0.0–0.2)

## 2020-07-05 LAB — TROPONIN I (HIGH SENSITIVITY)
Troponin I (High Sensitivity): 3 ng/L (ref <18)
Troponin I (High Sensitivity): 3 ng/L (ref <18)

## 2020-07-05 MED ORDER — SODIUM CHLORIDE 0.9% FLUSH: 3.0000 mL | Freq: Once | INTRAVENOUS | Status: DC

## 2020-07-05 NOTE — Discharge Instructions (Addendum)
Read instructions below for reasons to return to the Emergency Department. It is recommended that your follow up with your Primary Care Doctor in regards to today's visit.   Tests performed today include: An EKG of your heart A chest x-ray Cardiac enzymes - a blood test for heart muscle damage Blood counts and electrolytes  Your test results today were all normal  Chest Pain (Nonspecific)   SEEK MEDICAL CARE IF:  You think you are having problems from the medicine you are taking. Read your medicine instructions carefully.  Your chest pain does not go away, even after treatment.  You develop a rash with blisters on your chest.   SEEK IMMEDIATE MEDICAL CARE IF:  You have increased chest pain or pain that spreads to your arm, neck, jaw, back, or belly (abdomen).  You develop shortness of breath, an increasing cough, or you are coughing up blood.  You have severe back or abdominal pain, feel sick to your stomach (nauseous) or throw up (vomit).  You develop severe weakness, fainting, or chills.  You have an oral temperature above 102 F (38.9 C), not controlled by medicine.   THIS IS AN EMERGENCY. Do not wait to see if the pain will go away. Get medical help at once. Call 911. Do not drive yourself to the hospital.

## 2020-07-05 NOTE — Telephone Encounter (Signed)
Transferred pt to Triage Nurse. Pt has had chest pain off/on for a few days per, pt he has no difficulty breathing.

## 2020-07-05 NOTE — ED Provider Notes (Signed)
[facility] EMERGENCY DEPARTMENT Provider Note   CSN: [id number] Arrival date & time: 07/05/20  1359     History Chief Complaint  Patient presents with  . Chest Pain    Jackson Smith is a 54 y.o. male with past medical history significant for anemia, factor V Leyden mentation, GERD, anxiety.  HPI Patient presents to emergency department today with chief complaint of intermittent chest pain x2 weeks.  Patient states the pain is located in the center of his chest and radiates to left breast.  Patient describes the pain as a soreness.  He states the pain comes and goes.  Pain is not worse with exertion.  When he has the pain he rates it is 6/10 in severity.  He has not tried any medication for symptoms prior to arrival.  Patient does state he is under a lot of stress lately at work and being the caregiver of his mother. Denies dyspnea on exertion, SOB, chest tightness or pressure, radiation to left/right arm, jaw or back, nausea, or diaphoresis. He denies tobacco abuse. Family history of cardiac disease includes father with history of MI.    Past Medical History:  Diagnosis Date  . Allergy    seasonal  . Anemia    low iron  . Anxiety    panic attack  . Cholecystitis with cholelithiasis 01/10/2019  . Constipation   . Factor V Leiden mutation ([place]) 11/30/2012  . Frozen shoulder March 2015   right side, saw Dr. Fredna Dow, resolved with PT   . GERD (gastroesophageal reflux disease)   . Hemorrhoid     Patient Active Problem List   Diagnosis Date Noted  . Cholecystitis with cholelithiasis 01/10/2019  . Factor V Leiden mutation ([place]) 11/30/2012  . URI 11/25/2009  . ABDOMINAL PAIN, RIGHT LOWER QUADRANT 06/09/2009  . MUSCLE STRAIN, ABDOMINAL WALL 04/09/2009  . NUMBNESS 10/21/2008  . CONTUSION OF UPPER ARM 03/19/2008  . GENERALIZED ANXIETY DISORDER 03/12/2008  . GERD 03/12/2008  . GROIN PAIN 03/12/2008  . HIP PAIN 02/19/2008  . ALLERGIC RHINITIS 02/04/2008  .  EPIDIDYMITIS 02/04/2008    Past Surgical History:  Procedure Laterality Date  . ANKLE FRACTURE SURGERY    . CHOLECYSTECTOMY N/A 01/10/2019   Procedure: LAPAROSCOPIC CHOLECYSTECTOMY WITH attempted INTRAOPERATIVE CHOLANGIOGRAM;  Surgeon: Fanny Skates, MD;  Location: [facility];  Service: General;  Laterality: N/A;  . COLONOSCOPY  12/08/2016   per Dr. Havery Moros, benign polyps, repeat in 10 yrs   . GALLBLADDER SURGERY    . TESTICLE SURGERY  2010   decreased blood flow    . WISDOM TOOTH EXTRACTION    . wrist sprain     right       Family History  Problem Relation Age of Onset  . Diabetes Father   . Heart attack Father   . Kidney failure Father   . Birth defects Brother   . Factor V Leiden deficiency Brother   . Factor V Leiden deficiency Mother   . Breast cancer Sister   . Factor V Leiden deficiency Maternal Aunt   . Colon cancer Maternal Aunt   . Cancer Maternal Uncle 60       colon cancer  . Colon cancer Maternal Uncle 38       60's    Social History   Tobacco Use  . Smoking status: Never Smoker  . Smokeless tobacco: Never Used  Vaping Use  . Vaping Use: Never used  Substance Use Topics  . Alcohol use: No  Alcohol/week: 0.0 standard drinks  . Drug use: No    Home Medications Prior to Admission medications   Medication Sig Start Date End Date Taking? Authorizing Provider  acetaminophen (TYLENOL) 500 MG tablet Take 1,000 mg by mouth daily as needed for headache.   Yes [provider]  ALPRAZolam (XANAX) 0.5 MG tablet Take 1 tablet (0.5 mg total) by mouth 2 (two) times daily as needed for anxiety. Patient taking differently: Take 0.25-0.5 mg by mouth 2 (two) times daily as needed for anxiety.  03/26/20  Yes Laurey Morale, MD  fexofenadine-pseudoephedrine (ALLEGRA-D 24) 180-240 MG per 24 hr tablet Take 1 tablet by mouth daily as needed (seasonal allergies).    Yes [provider]  linaclotide (LINZESS) 72 MCG capsule Take 1 capsule (72 mcg total) by  mouth daily before breakfast. Patient taking differently: Take 72 mcg by mouth daily as needed (constipation).  06/02/20  Yes Willia Craze, NP  Omega-3 Fatty Acids (FISH OIL PO) Take 1 capsule by mouth daily.   Yes [provider]  Phenyleph-Doxyl-DM-Aspirin (ALKA-SELTZER PLUS NIGHT COLD) 7.8-6.25-10-500 MG TBEF Take 2 tablets by mouth at bedtime as needed (aches and pains).   Yes [provider]    Allergies    Cashew nut oil and Penicillins  Review of Systems   Review of Systems  All other systems are reviewed and are negative for acute change except as noted in the HPI.  Physical Exam Updated Vital Signs BP 125/86   Pulse 71   Temp 97.7 F (36.5 C) (Oral)   Resp 16   Ht 5\' 8"  (1.727 m)   Wt 85.3 kg   SpO2 100%   BMI 28.59 kg/m   Physical Exam Vitals and nursing note reviewed.  Constitutional:      General: He is not in acute distress.    Appearance: He is not ill-appearing.  HENT:     Head: Normocephalic and atraumatic.     Right Ear: Tympanic membrane and external ear normal.     Left Ear: Tympanic membrane and external ear normal.     Nose: Nose normal.     Mouth/Throat:     Mouth: Mucous membranes are moist.     Pharynx: Oropharynx is clear.  Eyes:     General: No scleral icterus.       Right eye: No discharge.        Left eye: No discharge.     Extraocular Movements: Extraocular movements intact.     Conjunctiva/sclera: Conjunctivae normal.     Pupils: Pupils are equal, round, and reactive to light.  Neck:     Vascular: No JVD.  Cardiovascular:     Rate and Rhythm: Normal rate and regular rhythm.     Pulses: Normal pulses.          Radial pulses are 2+ on the right side and 2+ on the left side.     Heart sounds: Normal heart sounds. No murmur heard.   Pulmonary:     Comments: Lungs clear to auscultation in all fields. Symmetric chest rise. No wheezing, rales, or rhonchi. Abdominal:     Comments: Abdomen is soft, non-distended, and  non-tender in all quadrants. No rigidity, no guarding. No peritoneal signs.  Musculoskeletal:        General: Normal range of motion.     Cervical back: Normal range of motion.     Right lower leg: No edema.     Left lower leg: No edema.  Skin:    General: Skin is warm and dry.     Capillary Refill: Capillary refill takes less than 2 seconds.  Neurological:     Mental Status: He is oriented to person, place, and time.     GCS: GCS eye subscore is 4. GCS verbal subscore is 5. GCS motor subscore is 6.     Comments: Fluent speech, no facial droop.  Psychiatric:        Behavior: Behavior normal.     ED Results / Procedures / Treatments   Labs (all labs ordered are listed, but only abnormal results are displayed) Labs Reviewed  BASIC METABOLIC PANEL  CBC  TROPONIN I (HIGH SENSITIVITY)  TROPONIN I (HIGH SENSITIVITY)    EKG None  Radiology DG Chest 2 View  Result Date: 07/05/2020 CLINICAL DATA:  Chest pain. EXAM: CHEST - 2 VIEW COMPARISON:  None. FINDINGS: The heart size and mediastinal contours are within normal limits. Both lungs are clear. No pneumothorax or pleural effusion is noted. The visualized skeletal structures are unremarkable. IMPRESSION: No active cardiopulmonary disease. Electronically Signed   By: Marijo Conception M.D.   On: 07/05/2020 15:00    Procedures Procedures (including critical care time)  Medications Ordered in ED Medications  sodium chloride flush (NS) 0.9 % injection 3 mL (has no administration in time range)    ED Course  I have reviewed the triage vital signs and the nursing notes.  Pertinent labs & imaging results that were available during my care of the patient were reviewed by me and considered in my medical decision making (see chart for details).    MDM Rules/Calculators/A&P                          History provided by patient with additional history obtained from chart review.    Patient presents to the emergency department with  chest pain. Patient nontoxic appearing, in no apparent distress, vitals without significant abnormality. Fairly benign physical exam. DDX: ACS, pulmonary embolism, dissection, pneumothorax, effusion, infiltrate, arrhythmia, anemia, electrolyte derangement, MSK. Evaluation initiated with labs, EKG, and CXR. Patient on cardiac monitor.   Work-up in the ER unremarkable. Labs reviewed, no leukocytosis, anemia, or significant electrolyte abnormality. CXR without infiltrate, effusion, pneumothorax, or fracture/dislocation.   Low risk heart score of 1, EKG without obvious ischemia, delta troponin negative, doubt ACS. Patient is low risk wells, PERC negative, doubt pulmonary embolism. Pain is not a tearing sensation, symmetric pulses, no widening of mediastinum on CXR, doubt dissection. Cardiac monitor reviewed, no notable arrhythmias or tachycardia. Patient has appeared hemodynamically stable throughout ER visit and appears safe for discharge with close pcp follow up. I discussed results, treatment plan, need for PCP follow-up, and return precautions with the patient. Provided opportunity for questions, patient confirmed understanding and is in agreement with plan.    Portions of this note were generated with [employer]. Dictation errors may occur despite best attempts at proofreading.  Final Clinical Impression(s) / ED Diagnoses Final diagnoses:  Atypical chest pain    Rx / DC Orders ED Discharge Orders    None       Flint Melter 07/06/20 0003    Mesner, Corene Cornea, MD 07/06/20 3154

## 2020-07-05 NOTE — ED Triage Notes (Signed)
Pt c/o left side cp since yesterday with no relief. Pt denies any nausea or vomiting, no sob.

## 2020-07-06 NOTE — ED Notes (Signed)
Patient verbalizes understanding of discharge instructions. Opportunity for questioning and answers were provided. Armband removed by staff, pt discharged from ED and ambulated to lobby to return home with family.  

## 2020-07-22 ENCOUNTER — Ambulatory Visit: Payer: Self-pay | Admitting: Gastroenterology

## 2020-08-19 ENCOUNTER — Other Ambulatory Visit: Payer: Self-pay

## 2020-08-20 ENCOUNTER — Ambulatory Visit (INDEPENDENT_AMBULATORY_CARE_PROVIDER_SITE_OTHER): Payer: 59 | Admitting: Family Medicine

## 2020-08-20 ENCOUNTER — Encounter: Payer: Self-pay | Admitting: Family Medicine

## 2020-08-20 VITALS — BP 120/62 | HR 78 | Temp 97.9°F | Ht 68.0 in | Wt 181.6 lb

## 2020-08-20 DIAGNOSIS — Z0000 Encounter for general adult medical examination without abnormal findings: Secondary | ICD-10-CM | POA: Diagnosis not present

## 2020-08-20 MED ORDER — BETAMETHASONE VALERATE 0.1 % EX OINT
1.0000 | TOPICAL_OINTMENT | Freq: Two times a day (BID) | CUTANEOUS | 5 refills | Status: DC
Start: 2020-08-20 — End: 2021-06-17

## 2020-08-20 NOTE — Progress Notes (Signed)
Subjective:    Patient ID: Jackson Smith, male    DOB: 1966/06/14, 54 y.o.   MRN: [id number]  HPI Here for a well exam. He is doing well in general. His eczema on the legs has been acting up. Using OTC cortisone cream with no relief. He has been on a keto diet for 2 months and he has lost 11 lbs.    Review of Systems  Constitutional: Negative.   HENT: Negative.   Eyes: Negative.   Respiratory: Negative.   Cardiovascular: Negative.   Gastrointestinal: Negative.   Genitourinary: Negative.   Musculoskeletal: Negative.   Skin: Positive for rash.  Neurological: Negative.   Psychiatric/Behavioral: Negative.        Objective:   Physical Exam Constitutional:      General: He is not in acute distress.    Appearance: He is well-developed. He is not diaphoretic.  HENT:     Head: Normocephalic and atraumatic.     Right Ear: External ear normal.     Left Ear: External ear normal.     Nose: Nose normal.     Mouth/Throat:     Pharynx: No oropharyngeal exudate.  Eyes:     General: No scleral icterus.       Right eye: No discharge.        Left eye: No discharge.     Conjunctiva/sclera: Conjunctivae normal.     Pupils: Pupils are equal, round, and reactive to light.  Neck:     Thyroid: No thyromegaly.     Vascular: No JVD.     Trachea: No tracheal deviation.  Cardiovascular:     Rate and Rhythm: Normal rate and regular rhythm.     Heart sounds: Normal heart sounds. No murmur heard.  No friction rub. No gallop.   Pulmonary:     Effort: Pulmonary effort is normal. No respiratory distress.     Breath sounds: Normal breath sounds. No wheezing or rales.  Chest:     Chest wall: No tenderness.  Abdominal:     General: Bowel sounds are normal. There is no distension.     Palpations: Abdomen is soft. There is no mass.     Tenderness: There is no abdominal tenderness. There is no guarding or rebound.  Genitourinary:    Penis: Normal. No tenderness.      Testes: Normal.      Prostate: Normal.     Rectum: Normal. Guaiac result negative.  Musculoskeletal:        General: No tenderness. Normal range of motion.     Cervical back: Neck supple.  Lymphadenopathy:     Cervical: No cervical adenopathy.  Skin:    General: Skin is warm and dry.     Coloration: Skin is not pale.     Comments: Numerous patches of macular scaly erythema on both legs   Neurological:     Mental Status: He is alert and oriented to person, place, and time.     Cranial Nerves: No cranial nerve deficit.     Motor: No abnormal muscle tone.     Coordination: Coordination normal.     Deep Tendon Reflexes: Reflexes are normal and symmetric. Reflexes normal.  Psychiatric:        Behavior: Behavior normal.        Thought Content: Thought content normal.        Judgment: Judgment normal.           Assessment & Plan:  Well exam. We discussed  diet and exercise. Get fasting labs. Treat the eczema with Betamethasone cream.  Alysia Penna, MD

## 2020-08-20 NOTE — Addendum Note (Signed)
Addended by: Marrion Coy on: 08/20/2020 09:40 AM   Modules accepted: Orders

## 2020-08-21 LAB — HEPATIC FUNCTION PANEL
AG Ratio: 1.5 (calc) (ref 1.0–2.5)
ALT: 22 U/L (ref 9–46)
AST: 21 U/L (ref 10–35)
Albumin: 4.3 g/dL (ref 3.6–5.1)
Alkaline phosphatase (APISO): 75 U/L (ref 35–144)
Bilirubin, Direct: 0.1 mg/dL (ref 0.0–0.2)
Globulin: 2.9 g/dL (calc) (ref 1.9–3.7)
Indirect Bilirubin: 0.3 mg/dL (calc) (ref 0.2–1.2)
Total Bilirubin: 0.4 mg/dL (ref 0.2–1.2)
Total Protein: 7.2 g/dL (ref 6.1–8.1)

## 2020-08-21 LAB — CBC WITH DIFFERENTIAL/PLATELET
Absolute Monocytes: 414 cells/uL (ref 200–950)
Basophils Absolute: 40 cells/uL (ref 0–200)
Basophils Relative: 0.9 %
Eosinophils Absolute: 180 cells/uL (ref 15–500)
Eosinophils Relative: 4.1 %
HCT: 42.7 % (ref 38.5–50.0)
Hemoglobin: 14.3 g/dL (ref 13.2–17.1)
Lymphs Abs: 1377 cells/uL (ref 850–3900)
MCH: 29.2 pg (ref 27.0–33.0)
MCHC: 33.5 g/dL (ref 32.0–36.0)
MCV: 87.1 fL (ref 80.0–100.0)
MPV: 10.7 fL (ref 7.5–12.5)
Monocytes Relative: 9.4 %
Neutro Abs: 2389 cells/uL (ref 1500–7800)
Neutrophils Relative %: 54.3 %
Platelets: 283 10*3/uL (ref 140–400)
RBC: 4.9 10*6/uL (ref 4.20–5.80)
RDW: 13.2 % (ref 11.0–15.0)
Total Lymphocyte: 31.3 %
WBC: 4.4 10*3/uL (ref 3.8–10.8)

## 2020-08-21 LAB — HEMOGLOBIN A1C
Hgb A1c MFr Bld: 5.2 % of total Hgb (ref <5.7)
Mean Plasma Glucose: 103 (calc)
eAG (mmol/L): 5.7 (calc)

## 2020-08-21 LAB — BASIC METABOLIC PANEL
BUN: 16 mg/dL (ref 7–25)
CO2: 25 mmol/L (ref 20–32)
Calcium: 9.4 mg/dL (ref 8.6–10.3)
Chloride: 102 mmol/L (ref 98–110)
Creat: 0.86 mg/dL (ref 0.70–1.33)
Glucose, Bld: 96 mg/dL (ref 65–99)
Potassium: 4.4 mmol/L (ref 3.5–5.3)
Sodium: 137 mmol/L (ref 135–146)

## 2020-08-21 LAB — TSH: TSH: 1.9 mIU/L (ref 0.40–4.50)

## 2020-08-21 LAB — LIPID PANEL
Cholesterol: 258 mg/dL — ABNORMAL HIGH (ref <200)
HDL: 47 mg/dL (ref >=40)
LDL Cholesterol (Calc): 193 mg/dL (calc) — ABNORMAL HIGH
Non-HDL Cholesterol (Calc): 211 mg/dL (calc) — ABNORMAL HIGH (ref <130)
Total CHOL/HDL Ratio: 5.5 (calc) — ABNORMAL HIGH (ref <5.0)
Triglycerides: 76 mg/dL (ref <150)

## 2020-08-21 LAB — PSA: PSA: 0.3 ng/mL (ref <=4.0)

## 2020-08-24 ENCOUNTER — Telehealth: Payer: Self-pay | Admitting: Family Medicine

## 2020-08-24 ENCOUNTER — Other Ambulatory Visit: Payer: Self-pay

## 2020-08-24 MED ORDER — ATORVASTATIN CALCIUM 10 MG PO TABS
10.0000 mg | ORAL_TABLET | Freq: Every day | ORAL | 3 refills | Status: DC
Start: 2020-08-24 — End: 2021-06-16

## 2020-08-24 NOTE — Telephone Encounter (Signed)
Discussed results with patient, patient expressed understanding. Nothing further needed. See result notes for further documentation.

## 2020-08-24 NOTE — Telephone Encounter (Signed)
Pt was returning Lindsay's call about lab results. Pt stated he will be available after 3:30pm for a call back

## 2020-08-30 ENCOUNTER — Telehealth: Payer: Self-pay | Admitting: Family Medicine

## 2020-08-30 NOTE — Telephone Encounter (Signed)
Pt stated the Physician Result form completed at his appt on 9/3 was incorrect and has to be refilled out. He attached the paper stating what info is missing. When completed he would like ti faxed to [facility] at [phone]   Placed in red folder

## 2020-09-01 NOTE — Telephone Encounter (Signed)
Form is in the red folder to be filled out by Dr. Sarajane Jews. Please advise when this is completed, so it can be faxed to [facility].

## 2020-09-15 NOTE — Telephone Encounter (Signed)
Faxed today

## 2020-09-15 NOTE — Telephone Encounter (Signed)
The form is ready  

## 2020-11-17 ENCOUNTER — Telehealth: Payer: Self-pay | Admitting: Family Medicine

## 2020-11-17 DIAGNOSIS — E785 Hyperlipidemia, unspecified: Secondary | ICD-10-CM

## 2020-11-17 NOTE — Telephone Encounter (Signed)
lft VM to rtn call. Dm/cma  

## 2020-11-17 NOTE — Telephone Encounter (Signed)
Patient is calling and stated that the last time he came in his cholesterol was high and wanted to see if he needed to do a recheck, please advise. CB is [phone]

## 2020-11-17 NOTE — Telephone Encounter (Signed)
Spoke to patient and he would like to know if he needs to have an OV or just a lab appt to do repeat cholesterol.   If he needs an  appt 12/22 would be best for him and best time to call back is after 3:!5 pm.   Please review and advise.   Thanks.   Dm/cma

## 2020-11-18 ENCOUNTER — Encounter: Payer: Self-pay | Admitting: Family Medicine

## 2020-11-18 DIAGNOSIS — E785 Hyperlipidemia, unspecified: Secondary | ICD-10-CM | POA: Insufficient documentation

## 2020-11-18 NOTE — Addendum Note (Signed)
Addended by: Alysia Penna A on: 11/18/2020 12:16 PM   Modules accepted: Orders

## 2020-11-18 NOTE — Telephone Encounter (Signed)
Pt awaking a call back from the message he received on yesterday pt states he will be available for that call back after 3:00 pm please call his cell phone [phone]

## 2020-11-18 NOTE — Telephone Encounter (Signed)
Just labs, no need for an OV. I put on the orders to recheck the lipids, so he can make a lab appt. Ask if he is still taking the Lipitor or not

## 2020-11-18 NOTE — Telephone Encounter (Signed)
Left message informing patient to schedule lab appointment.  Also requested patient confirm if he is taking lipitor or not.

## 2020-11-22 NOTE — Telephone Encounter (Signed)
Yes this can be a side effect of this type of medication. Try taking it every other day instead

## 2020-12-08 ENCOUNTER — Other Ambulatory Visit (INDEPENDENT_AMBULATORY_CARE_PROVIDER_SITE_OTHER): Payer: 59

## 2020-12-08 ENCOUNTER — Other Ambulatory Visit: Payer: Self-pay

## 2020-12-08 DIAGNOSIS — E785 Hyperlipidemia, unspecified: Secondary | ICD-10-CM

## 2020-12-08 LAB — HEPATIC FUNCTION PANEL
ALT: 22 U/L (ref 0–53)
AST: 21 U/L (ref 0–37)
Albumin: 4.2 g/dL (ref 3.5–5.2)
Alkaline Phosphatase: 80 U/L (ref 39–117)
Bilirubin, Direct: 0.1 mg/dL (ref 0.0–0.3)
Total Bilirubin: 0.6 mg/dL (ref 0.2–1.2)
Total Protein: 7 g/dL (ref 6.0–8.3)

## 2020-12-08 LAB — LIPID PANEL
Cholesterol: 147 mg/dL (ref 0–200)
HDL: 46.1 mg/dL (ref >39.00)
LDL Cholesterol: 80 mg/dL (ref 0–99)
NonHDL: 100.92
Total CHOL/HDL Ratio: 3
Triglycerides: 103 mg/dL (ref 0.0–149.0)
VLDL: 20.6 mg/dL (ref 0.0–40.0)

## 2020-12-08 NOTE — Addendum Note (Signed)
Addended by: Loreen Bankson L on: 12/08/2020 07:53 AM ° ° Modules accepted: Orders ° °

## 2020-12-08 NOTE — Addendum Note (Signed)
Addended by: Janann Colonel on: 12/08/2020 07:53 AM   Modules accepted: Orders

## 2020-12-13 ENCOUNTER — Other Ambulatory Visit: Payer: 59

## 2021-06-15 ENCOUNTER — Other Ambulatory Visit: Payer: Self-pay

## 2021-06-16 ENCOUNTER — Ambulatory Visit (INDEPENDENT_AMBULATORY_CARE_PROVIDER_SITE_OTHER): Payer: 59 | Admitting: Family Medicine

## 2021-06-16 ENCOUNTER — Encounter: Payer: Self-pay | Admitting: Family Medicine

## 2021-06-16 VITALS — BP 108/80 | HR 70 | Temp 98.1°F | Ht 68.25 in | Wt 197.1 lb

## 2021-06-16 DIAGNOSIS — Z0000 Encounter for general adult medical examination without abnormal findings: Secondary | ICD-10-CM

## 2021-06-16 LAB — CBC WITH DIFFERENTIAL/PLATELET
Basophils Absolute: 0 K/uL (ref 0.0–0.1)
Basophils Relative: 0.8 % (ref 0.0–3.0)
Eosinophils Absolute: 0.2 K/uL (ref 0.0–0.7)
Eosinophils Relative: 4.2 % (ref 0.0–5.0)
HCT: 40 % (ref 39.0–52.0)
Hemoglobin: 13.9 g/dL (ref 13.0–17.0)
Lymphocytes Relative: 35.8 % (ref 12.0–46.0)
Lymphs Abs: 1.7 K/uL (ref 0.7–4.0)
MCHC: 34.7 g/dL (ref 30.0–36.0)
MCV: 85.7 fl (ref 78.0–100.0)
Monocytes Absolute: 0.6 K/uL (ref 0.1–1.0)
Monocytes Relative: 11.5 % (ref 3.0–12.0)
Neutro Abs: 2.3 K/uL (ref 1.4–7.7)
Neutrophils Relative %: 47.7 % (ref 43.0–77.0)
Platelets: 242 K/uL (ref 150.0–400.0)
RBC: 4.67 Mil/uL (ref 4.22–5.81)
RDW: 14.3 % (ref 11.5–15.5)
WBC: 4.8 K/uL (ref 4.0–10.5)

## 2021-06-16 LAB — T3, FREE: T3, Free: 3.6 pg/mL (ref 2.3–4.2)

## 2021-06-16 LAB — LIPID PANEL
Cholesterol: 189 mg/dL (ref 0–200)
HDL: 38.4 mg/dL — ABNORMAL LOW (ref >39.00)
LDL Cholesterol: 134 mg/dL — ABNORMAL HIGH (ref 0–99)
NonHDL: 150.76
Total CHOL/HDL Ratio: 5
Triglycerides: 86 mg/dL (ref 0.0–149.0)
VLDL: 17.2 mg/dL (ref 0.0–40.0)

## 2021-06-16 LAB — BASIC METABOLIC PANEL
BUN: 16 mg/dL (ref 6–23)
CO2: 29 mEq/L (ref 19–32)
Calcium: 9.2 mg/dL (ref 8.4–10.5)
Chloride: 101 mEq/L (ref 96–112)
Creatinine, Ser: 1.03 mg/dL (ref 0.40–1.50)
GFR: 82.29 mL/min (ref >60.00)
Glucose, Bld: 90 mg/dL (ref 70–99)
Potassium: 4.5 mEq/L (ref 3.5–5.1)
Sodium: 137 mEq/L (ref 135–145)

## 2021-06-16 LAB — HEPATIC FUNCTION PANEL
ALT: 20 U/L (ref 0–53)
AST: 24 U/L (ref 0–37)
Albumin: 4.3 g/dL (ref 3.5–5.2)
Alkaline Phosphatase: 70 U/L (ref 39–117)
Bilirubin, Direct: 0.1 mg/dL (ref 0.0–0.3)
Total Bilirubin: 0.6 mg/dL (ref 0.2–1.2)
Total Protein: 7 g/dL (ref 6.0–8.3)

## 2021-06-16 LAB — HEMOGLOBIN A1C: Hgb A1c MFr Bld: 5.6 % (ref 4.6–6.5)

## 2021-06-16 LAB — TSH: TSH: 1.88 u[IU]/mL (ref 0.35–5.50)

## 2021-06-16 LAB — PSA: PSA: 0.33 ng/mL (ref 0.10–4.00)

## 2021-06-16 LAB — T4, FREE: Free T4: 0.8 ng/dL (ref 0.60–1.60)

## 2021-06-16 NOTE — Progress Notes (Signed)
Subjective:    Patient ID: Jackson Smith, male    DOB: 23-Jul-1966, 55 y.o.   MRN: [id number]  HPI Here for a well exam. He feels well. He does mention having to get up 2-3 times a night to urinate and this disturbs his sleeps. Last year he tried Lipitor for 3 months but he had to sto pit due to painful muscle aches. He is now simply watching his diet.    Review of Systems  Constitutional: Negative.   HENT: Negative.    Eyes: Negative.   Respiratory: Negative.    Cardiovascular: Negative.   Gastrointestinal: Negative.   Genitourinary:  Positive for frequency.  Musculoskeletal: Negative.   Skin: Negative.   Neurological: Negative.   Psychiatric/Behavioral: Negative.        Objective:   Physical Exam Constitutional:      General: He is not in acute distress.    Appearance: Normal appearance. He is well-developed. He is not diaphoretic.  HENT:     Head: Normocephalic and atraumatic.     Right Ear: External ear normal.     Left Ear: External ear normal.     Nose: Nose normal.     Mouth/Throat:     Pharynx: No oropharyngeal exudate.  Eyes:     General: No scleral icterus.       Right eye: No discharge.        Left eye: No discharge.     Conjunctiva/sclera: Conjunctivae normal.     Pupils: Pupils are equal, round, and reactive to light.  Neck:     Thyroid: No thyromegaly.     Vascular: No JVD.     Trachea: No tracheal deviation.  Cardiovascular:     Rate and Rhythm: Normal rate and regular rhythm.     Heart sounds: Normal heart sounds. No murmur heard.   No friction rub. No gallop.  Pulmonary:     Effort: Pulmonary effort is normal. No respiratory distress.     Breath sounds: Normal breath sounds. No wheezing or rales.  Chest:     Chest wall: No tenderness.  Abdominal:     General: Bowel sounds are normal. There is no distension.     Palpations: Abdomen is soft. There is no mass.     Tenderness: There is no abdominal tenderness. There is no guarding or  rebound.  Genitourinary:    Penis: Normal. No tenderness.      Testes: Normal.     Prostate: Normal.     Rectum: Normal. Guaiac result negative.  Musculoskeletal:        General: No tenderness. Normal range of motion.     Cervical back: Neck supple.  Lymphadenopathy:     Cervical: No cervical adenopathy.  Skin:    General: Skin is warm and dry.     Coloration: Skin is not pale.     Findings: No erythema or rash.  Neurological:     Mental Status: He is alert and oriented to person, place, and time.     Cranial Nerves: No cranial nerve deficit.     Motor: No abnormal muscle tone.     Coordination: Coordination normal.     Deep Tendon Reflexes: Reflexes are normal and symmetric. Reflexes normal.  Psychiatric:        Behavior: Behavior normal.        Thought Content: Thought content normal.        Judgment: Judgment normal.          Assessment &  Plan:  Well exam. We discussed diet and exercise. Get fasting labs. He likely has some BPH, so he will try saw palmetto OTC. Alysia Penna, MD

## 2021-06-17 ENCOUNTER — Other Ambulatory Visit: Payer: Self-pay

## 2021-06-17 MED ORDER — BETAMETHASONE VALERATE 0.1 % EX OINT: 1.0000 "application " | TOPICAL_OINTMENT | Freq: Two times a day (BID) | CUTANEOUS | 2 refills | Status: DC

## 2021-08-12 ENCOUNTER — Other Ambulatory Visit: Payer: Self-pay

## 2021-08-12 ENCOUNTER — Encounter: Payer: Self-pay | Admitting: Family Medicine

## 2021-08-12 MED ORDER — ROSUVASTATIN CALCIUM 5 MG PO TABS
5.0000 mg | ORAL_TABLET | Freq: Every day | ORAL | 5 refills | Status: DC
Start: 2021-08-12 — End: 2022-02-09

## 2021-08-12 NOTE — Telephone Encounter (Signed)
He can try Crestor 5 mg daily. Call in #30 with 5 rf

## 2021-10-13 ENCOUNTER — Telehealth: Payer: Self-pay | Admitting: Family Medicine

## 2021-10-13 NOTE — Telephone Encounter (Signed)
Patient needs a refill on Aprazolam.  Patient states he doesn't take it all the time, just sometimes when he needs it.    [facility] [address]

## 2021-10-14 NOTE — Telephone Encounter (Signed)
Please advise 

## 2021-10-14 NOTE — Telephone Encounter (Signed)
Patient called back requesting Rx  ALPRAZolam Duanne Moron) 0.5 MG tablet Send to [facility]

## 2021-10-14 NOTE — Telephone Encounter (Signed)
Please send refill to [facility] on [address] in [place].  Lvm for patient only [facility] on [address] is located in [place], and to call office if clarity is needed otherwise refill will be sent there.  Last refill--03/26/2020--60 tabs, 5 refills Last OV- 06/16/2021

## 2021-10-18 MED ORDER — ALPRAZOLAM 0.5 MG PO TABS: 0.5000 mg | ORAL_TABLET | Freq: Two times a day (BID) | ORAL | 0 refills | Status: DC | PRN

## 2021-10-18 NOTE — Addendum Note (Signed)
Addended by: Apolinar Junes on: 10/18/2021 11:12 AM   Modules accepted: Orders

## 2021-10-18 NOTE — Telephone Encounter (Signed)
Pt is almost out of alprazolam and he is checking on the status of refill. Pt is aware md out of office this week

## 2021-12-23 ENCOUNTER — Ambulatory Visit (INDEPENDENT_AMBULATORY_CARE_PROVIDER_SITE_OTHER): Payer: 59 | Admitting: Family Medicine

## 2021-12-23 VITALS — BP 116/70 | HR 66 | Temp 97.6°F | Ht 68.25 in | Wt 201.9 lb

## 2021-12-23 DIAGNOSIS — L853 Xerosis cutis: Secondary | ICD-10-CM

## 2021-12-23 DIAGNOSIS — L290 Pruritus ani: Secondary | ICD-10-CM | POA: Diagnosis not present

## 2021-12-23 MED ORDER — TRIAMCINOLONE ACETONIDE 0.1 % EX CREA: 1.0000 "application " | TOPICAL_CREAM | Freq: Two times a day (BID) | CUTANEOUS | 2 refills | Status: DC | PRN

## 2021-12-23 NOTE — Progress Notes (Signed)
Established Patient Office Visit  Subjective:  Patient ID: Jackson Smith, male    DOB: Jun 20, 1966  Age: 56 y.o. MRN: [id number]  CC:  Chief Complaint  Patient presents with   Rash    HPI Jackson Smith presents for perianal pruritus.  He states about 5 months ago he started increased walking program.  His itching seem to start after that.  He initially tried some sitz bath's.  No improvement.  He tried over-the-counter topical such as Desitin without much improvement.  Denies any recent diarrhea.  He tries to use unscented toilet paper is much as possible.  He also has very dry skin generally.  He has some dermatitis patches on his upper extremities and lower legs.  Has not used any moisturizers.  Past Medical History:  Diagnosis Date   Allergy    seasonal   Anemia    low iron   Anxiety    panic attack   Cholecystitis with cholelithiasis 01/10/2019   Constipation    Factor V Leiden mutation ([place]) 11/30/2012   Frozen shoulder March 2015   right side, saw Dr. Fredna Dow, resolved with PT    GERD (gastroesophageal reflux disease)    Hemorrhoid     Past Surgical History:  Procedure Laterality Date   ANKLE FRACTURE SURGERY     CHOLECYSTECTOMY N/A 01/10/2019   Procedure: LAPAROSCOPIC CHOLECYSTECTOMY WITH attempted INTRAOPERATIVE CHOLANGIOGRAM;  Surgeon: Fanny Skates, MD;  Location: [facility];  Service: General;  Laterality: N/A;   COLONOSCOPY  12/08/2016   per Dr. Havery Moros, benign polyps, repeat in 7 yrs    [place]  2010   decreased blood flow     WISDOM TOOTH EXTRACTION     wrist sprain     right    Family History  Problem Relation Age of Onset   Diabetes Father    Heart attack Father    Kidney failure Father    Birth defects Brother    Factor V Leiden deficiency Brother    Factor V Leiden deficiency Mother    Breast cancer Sister    Factor V Leiden deficiency Maternal Aunt    Colon cancer Maternal Aunt    Cancer  Maternal Uncle 60       colon cancer   Colon cancer Maternal Uncle 60       60's    Social History   Socioeconomic History   Marital status: Single    Spouse name: Not on file   Number of children: 0   Years of education: Not on file   Highest education level: Bachelor's degree (e.g., BA, AB, BS)  Occupational History    Employer: [employer]  Tobacco Use   Smoking status: Never   Smokeless tobacco: Never  Vaping Use   Vaping Use: Never used  Substance and Sexual Activity   Alcohol use: No    Alcohol/week: 0.0 standard drinks   Drug use: No   Sexual activity: Not on file  Other Topics Concern   Not on file  Social History Narrative   Not on file   Social Determinants of Health   Financial Resource Strain: Low Risk    Difficulty of Paying Living Expenses: Not hard at all  Food Insecurity: No Food Insecurity   Worried About Charity fundraiser in the Last Year: Never true   Ran Out of Food in the Last Year: Never true  Transportation Needs: No Transportation Needs  Lack of Transportation (Medical): No   Lack of Transportation (Non-Medical): No  Physical Activity: Unknown   Days of Exercise per Week: Patient refused   Minutes of Exercise per Session: Not on file  Stress: No Stress Concern Present   Feeling of Stress : Only a little  Social Connections: Unknown   Frequency of Communication with Friends and Family: More than three times a week   Frequency of Social Gatherings with Friends and Family: More than three times a week   Attends Religious Services: Patient refused   Active Member of Clubs or Organizations: Patient refused   Attends Music therapist: Not on file   Marital Status: Never married  Intimate Partner Violence: Not on file    Outpatient Medications Prior to Visit  Medication Sig Dispense Refill   acetaminophen (TYLENOL) 500 MG tablet Take 1,000 mg by mouth daily as needed for headache.     ALPRAZolam (XANAX) 0.5 MG tablet  Take 1 tablet (0.5 mg total) by mouth 2 (two) times daily as needed for anxiety. 60 tablet 0   fexofenadine-pseudoephedrine (ALLEGRA-D 24) 180-240 MG per 24 hr tablet Take 1 tablet by mouth daily as needed (seasonal allergies).      Omega-3 Fatty Acids (FISH OIL PO) Take 1 capsule by mouth daily.     Phenyleph-Doxyl-DM-Aspirin (ALKA-SELTZER PLUS NIGHT COLD) 7.8-6.25-10-500 MG TBEF Take 2 tablets by mouth at bedtime as needed (aches and pains).     rosuvastatin (CRESTOR) 5 MG tablet Take 1 tablet (5 mg total) by mouth daily. 30 tablet 5   betamethasone valerate ointment (VALISONE) 0.1 % Apply 1 application topically 2 (two) times daily. 45 g 2   No facility-administered medications prior to visit.    Allergies  Allergen Reactions   Lipitor [Atorvastatin] Other (See Comments)    Myalgia    Cashew Nut Oil Rash   Penicillins Rash    Did it involve swelling of the face/tongue/throat, SOB, or low BP? No Did it involve sudden or severe rash/hives, skin peeling, or any reaction on the inside of your mouth or nose? No Did you need to seek medical attention at a hospital or doctor's office? No When did it last happen?      childhood allergy If all above answers are "NO", may proceed with cephalosporin use.     ROS Review of Systems  Constitutional:  Negative for chills and fever.  Gastrointestinal:  Negative for diarrhea.  Skin:  Positive for rash.     Objective:    Physical Exam Vitals reviewed.  Constitutional:      Appearance: Normal appearance.  Cardiovascular:     Rate and Rhythm: Normal rate and regular rhythm.  Skin:    Findings: Rash present.     Comments: Perianal region reveals no hemorrhoids.  Very mild erythema and a zone about 2 cm surrounding the anus.  No concerning lesions noted.  We did not notice any rash in his groin region.  He does have generally very dry skin on his extremities and some slightly excoriated dry areas of dermatitis and patch like distribution  upper extremities and lower legs bilaterally  Neurological:     Mental Status: He is alert.    BP 116/70 (BP Location: Left Arm, Patient Position: Sitting, Cuff Size: Normal)    Pulse 66    Temp 97.6 F (36.4 C) (Oral)    Ht 5' 8.25" (1.734 m)    Wt 201 lb 14.4 oz (91.6 kg)    SpO2 99%  BMI 30.47 kg/m  Wt Readings from Last 3 Encounters:  12/23/21 201 lb 14.4 oz (91.6 kg)  06/16/21 197 lb 2 oz (89.4 kg)  08/20/20 181 lb 9.6 oz (82.4 kg)     Health Maintenance Due  Topic Date Due   HIV Screening  Never done   Hepatitis C Screening  Never done   Zoster Vaccines- Shingrix (1 of 2) Never done    There are no preventive care reminders to display for this patient.  Lab Results  Component Value Date   TSH 1.88 06/16/2021   Lab Results  Component Value Date   WBC 4.8 06/16/2021   HGB 13.9 06/16/2021   HCT 40.0 06/16/2021   MCV 85.7 06/16/2021   PLT 242.0 06/16/2021   Lab Results  Component Value Date   NA 137 06/16/2021   K 4.5 06/16/2021   CO2 29 06/16/2021   GLUCOSE 90 06/16/2021   BUN 16 06/16/2021   CREATININE 1.03 06/16/2021   BILITOT 0.6 06/16/2021   ALKPHOS 70 06/16/2021   AST 24 06/16/2021   ALT 20 06/16/2021   PROT 7.0 06/16/2021   ALBUMIN 4.3 06/16/2021   CALCIUM 9.2 06/16/2021   ANIONGAP 9 07/05/2020   GFR 82.29 06/16/2021   Lab Results  Component Value Date   CHOL 189 06/16/2021   Lab Results  Component Value Date   HDL 38.40 (L) 06/16/2021   Lab Results  Component Value Date   LDLCALC 134 (H) 06/16/2021   Lab Results  Component Value Date   TRIG 86.0 06/16/2021   Lab Results  Component Value Date   CHOLHDL 5 06/16/2021   Lab Results  Component Value Date   HGBA1C 5.6 06/16/2021      Assessment & Plan:   #1 perianal pruritus.  Minimal rash at this time.  Etiology unclear.  This does not look fungal.  We discussed trial of triamcinolone 0.1% cream to use twice daily as needed but only as needed and not continuously.  #2 dry skin  dermatitis.  We discussed factors such as brief duration of showers and avoidance of hot water is much as possible.  We have strongly recommended liberal use of moisturizer specially after bathing.  Can also use the triamcinolone cream as needed twice daily as needed  Meds ordered this encounter  Medications   triamcinolone cream (KENALOG) 0.1 %    Sig: Apply 1 application topically 2 (two) times daily as needed.    Dispense:  30 g    Refill:  2    Follow-up: No follow-ups on file.    Carolann Littler, MD

## 2022-02-09 ENCOUNTER — Other Ambulatory Visit: Payer: Self-pay

## 2022-02-09 ENCOUNTER — Telehealth: Payer: Self-pay | Admitting: Family Medicine

## 2022-02-09 MED ORDER — ROSUVASTATIN CALCIUM 5 MG PO TABS: 5.0000 mg | ORAL_TABLET | Freq: Every day | ORAL | 5 refills | Status: DC

## 2022-02-09 NOTE — Telephone Encounter (Signed)
Patient called in stating that he just finished his last refill of rosuvastatin (CRESTOR) 5 MG tablet [[id number]]  and he didn't know if Dr.Fry wanted him to request a refill to continue to take it or if he should just leave it alone.  Patient could be contacted at [phone].  Please advise.

## 2022-02-09 NOTE — Telephone Encounter (Signed)
Pt Rx sent to his pharmacy for refill

## 2022-05-13 ENCOUNTER — Emergency Department (HOSPITAL_BASED_OUTPATIENT_CLINIC_OR_DEPARTMENT_OTHER): Payer: 59

## 2022-05-13 ENCOUNTER — Emergency Department (HOSPITAL_BASED_OUTPATIENT_CLINIC_OR_DEPARTMENT_OTHER)
Admission: EM | Admit: 2022-05-13 | Discharge: 2022-05-13 | Disposition: A | Discharge status: Home / Self Care | Payer: 59 | Attending: Emergency Medicine | Admitting: Emergency Medicine

## 2022-05-13 ENCOUNTER — Other Ambulatory Visit: Payer: Self-pay

## 2022-05-13 ENCOUNTER — Emergency Department (HOSPITAL_BASED_OUTPATIENT_CLINIC_OR_DEPARTMENT_OTHER): Payer: 59 | Admitting: Radiology

## 2022-05-13 ENCOUNTER — Encounter (HOSPITAL_BASED_OUTPATIENT_CLINIC_OR_DEPARTMENT_OTHER): Payer: Self-pay | Admitting: Emergency Medicine

## 2022-05-13 DIAGNOSIS — X501XXA Overexertion from prolonged static or awkward postures, initial encounter: Secondary | ICD-10-CM | POA: Insufficient documentation

## 2022-05-13 DIAGNOSIS — S92031A Displaced avulsion fracture of tuberosity of right calcaneus, initial encounter for closed fracture: Secondary | ICD-10-CM

## 2022-05-13 DIAGNOSIS — M25571 Pain in right ankle and joints of right foot: Secondary | ICD-10-CM | POA: Diagnosis present

## 2022-05-13 NOTE — Discharge Instructions (Signed)
You may bear weight as tolerated. Wear the boot until cleared by the orthopedist

## 2022-05-13 NOTE — ED Provider Notes (Signed)
[facility] DE[facility] Provider Note   CSN: [id number] Arrival date & time: 05/13/22  1445     History  Chief Complaint  Patient presents with   Ankle Pain    Chiron Campione is a 56 y.o. male.  HPI 56 year old male presents after twisting his right ankle.  He was going down some stairs and a kid left his shoe out and he stepped on the shoe and twisted his ankle.  Has lateral pain to his proximal foot and ankle.  Has been able to ambulate on it.  Seemed a little swollen but he put some ice on it and took some ibuprofen.  No numbness.  He has a history of factor V Leiden and his family wanted him to get checked out due to this. He is not on anticoagulation.  Home Medications Prior to Admission medications   Medication Sig Start Date End Date Taking? Authorizing Provider  acetaminophen (TYLENOL) 500 MG tablet Take 1,000 mg by mouth daily as needed for headache.    [provider]  ALPRAZolam Duanne Moron) 0.5 MG tablet Take 1 tablet (0.5 mg total) by mouth 2 (two) times daily as needed for anxiety. 10/18/21   Nafziger, Tommi Rumps, NP  fexofenadine-pseudoephedrine (ALLEGRA-D 24) 180-240 MG per 24 hr tablet Take 1 tablet by mouth daily as needed (seasonal allergies).     [provider]  Omega-3 Fatty Acids (FISH OIL PO) Take 1 capsule by mouth daily.    [provider]  Phenyleph-Doxyl-DM-Aspirin (ALKA-SELTZER PLUS NIGHT COLD) 7.8-6.25-10-500 MG TBEF Take 2 tablets by mouth at bedtime as needed (aches and pains).    [provider]  rosuvastatin (CRESTOR) 5 MG tablet Take 1 tablet (5 mg total) by mouth daily. 02/09/22   Laurey Morale, MD  triamcinolone cream (KENALOG) 0.1 % Apply 1 application topically 2 (two) times daily as needed. 12/23/21   Burchette, Alinda Sierras, MD      Allergies    Lipitor [atorvastatin], Cashew nut oil, and Penicillins    Review of Systems   Review of Systems  Musculoskeletal:  Positive for arthralgias and joint  swelling.  Neurological:  Negative for weakness and numbness.   Physical Exam Updated Vital Signs BP 114/80 (BP Location: Right Arm)   Pulse 73   Temp 98.2 F (36.8 C) (Oral)   Resp 16   SpO2 100%  Physical Exam Vitals and nursing note reviewed.  Constitutional:      Appearance: He is well-developed.  HENT:     Head: Normocephalic and atraumatic.  Cardiovascular:     Rate and Rhythm: Normal rate and regular rhythm.     Pulses:          Dorsalis pedis pulses are 2+ on the right side.  Pulmonary:     Effort: Pulmonary effort is normal.  Musculoskeletal:     Right lower leg: No swelling or tenderness.     Right ankle: No deformity or lacerations. Tenderness present. Decreased range of motion.     Right Achilles Tendon: No tenderness or defects.     Right foot: Swelling and tenderness present.       Legs:  Skin:    General: Skin is warm and dry.  Neurological:     Mental Status: He is alert.    ED Results / Procedures / Treatments   Labs (all labs ordered are listed, but only abnormal results are displayed) Labs Reviewed - No data to display  EKG None  Radiology DG Ankle Complete Right  Result Date: 05/13/2022 CLINICAL DATA:  Twisting injury to the right foot and ankle. Pain and bruising. EXAM: RIGHT ANKLE - COMPLETE 3+ VIEW COMPARISON:  None Available. FINDINGS: No fracture or bone lesion. Ankle joint normally spaced and aligned. Small dorsal and plantar calcaneal spurs. Soft tissues are unremarkable. IMPRESSION: No fracture or dislocation. Electronically Signed   By: Lajean Manes M.D.   On: 05/13/2022 15:41   CT Foot Right Wo Contrast  Result Date: 05/13/2022 CLINICAL DATA:  Foot trauma. Occult fracture suspected. Stepped off stair and twisted right foot/ankle. Possible calcaneus avulsion on radiographs. EXAM: CT OF THE RIGHT FOOT WITHOUT CONTRAST TECHNIQUE: Multidetector CT imaging of the right foot was performed according to the standard protocol. Multiplanar CT  image reconstructions were also generated. RADIATION DOSE REDUCTION: This exam was performed according to the departmental dose-optimization program which includes automated exposure control, adjustment of the mA and/or kV according to patient size and/or use of iterative reconstruction technique. COMPARISON:  Right ankle and foot radiographs 05/13/2022 FINDINGS: Bones/Joint/Cartilage There is a tiny curvilinear ossific density measuring approximately 4 x 1 x 6 mm (transverse by AP by craniocaudal) just lateral to the slightly superior aspect of the far anterior portion of the anterior process of the calcaneus. There is moderate overlying soft tissue swelling and edema. This may represent a small avulsion injury from the overlying extensor digitorum brevis muscle. Mild-to-moderate plantar calcaneal heel spur. Mild chronic spurring at the Achilles insertion on the calcaneus. Ligaments Suboptimally assessed by CT. Muscles and Tendons Two small os peroneum normal variant ossicles are seen within the peroneus longus tendon as it courses under the calcaneocuboid articulation. Soft tissues Dorsal lateral midfoot soft tissue swelling adjacent to the above-described avulsion injury. IMPRESSION: Tiny curvilinear avulsion fracture at the superolateral aspect of the anterior process of the calcaneus, likely an avulsion at the overlying extensor digitorum brevis muscle attachment. Electronically Signed   By: Yvonne Kendall M.D.   On: 05/13/2022 17:24   DG Foot Complete Right  Result Date: 05/13/2022 CLINICAL DATA:  Twisted left foot/ankle stepping off ladder. Pain and bruising to lateral foot. EXAM: RIGHT FOOT COMPLETE - 3+ VIEW COMPARISON:  None Available. FINDINGS: There is a curvilinear ossific density adjacent to calcaneus which is suspicious for avulsion injury. There is associated overlying soft tissue swelling. No additional fracture or dislocation identified. Heel spurs. IMPRESSION: Suspect avulsion injury involving  the lateral aspect of the calcaneus. Correlate for any focal tenderness over the calcaneus. Electronically Signed   By: Kerby Moors M.D.   On: 05/13/2022 15:43    Procedures Procedures    Medications Ordered in ED Medications - No data to display  ED Course/ Medical Decision Making/ A&P Clinical Course as of 05/13/22 1831  Sat May 13, 2022  1614 I discussed xray findings with Dr. Kathaleen Bury.  He advises CT to rule out fracture in the calcaneus itself.  If it is just an avulsion fracture he can follow-up next week with orthopedics and can have a boot with weightbearing as tolerated.  Will call back if there is an actual fracture through the calcaneus. [SG]    Clinical Course User Index [SG] Sherwood Gambler, MD                           Medical Decision Making Amount and/or Complexity of Data Reviewed External Data Reviewed: notes. Radiology: ordered and independent interpretation performed.   Patient is neurovascularly intact.  X-ray images viewed by myself  and there is no ankle fracture but there is a small avulsion next of the calcaneus.  I personally interpreted these images.  As above, orthopedics recommends CT.  The CT does confirm the avulsion fracture but no other fracture to the calcaneus.  As per plan will put in a cam walker and have him weight-bear as tolerated and follow-up with orthopedics.  He declines anything for pain.        Final Clinical Impression(s) / ED Diagnoses Final diagnoses:  Closed displaced avulsion fracture of tuberosity of right calcaneus, initial encounter    Rx / DC Orders ED Discharge Orders     None         Sherwood Gambler, MD 05/13/22 [id number]

## 2022-05-13 NOTE — ED Triage Notes (Signed)
Twisted right ankle

## 2022-06-19 ENCOUNTER — Ambulatory Visit (INDEPENDENT_AMBULATORY_CARE_PROVIDER_SITE_OTHER): Payer: 59 | Admitting: Family Medicine

## 2022-06-19 ENCOUNTER — Encounter: Payer: Self-pay | Admitting: Family Medicine

## 2022-06-19 VITALS — BP 98/68 | HR 64 | Temp 98.7°F | Ht 68.5 in | Wt 191.0 lb

## 2022-06-19 DIAGNOSIS — Z0000 Encounter for general adult medical examination without abnormal findings: Secondary | ICD-10-CM | POA: Diagnosis not present

## 2022-06-19 DIAGNOSIS — F411 Generalized anxiety disorder: Secondary | ICD-10-CM

## 2022-06-19 LAB — HEPATIC FUNCTION PANEL
ALT: 24 U/L (ref 0–53)
AST: 24 U/L (ref 0–37)
Albumin: 4.3 g/dL (ref 3.5–5.2)
Alkaline Phosphatase: 72 U/L (ref 39–117)
Bilirubin, Direct: 0.1 mg/dL (ref 0.0–0.3)
Total Bilirubin: 0.6 mg/dL (ref 0.2–1.2)
Total Protein: 7 g/dL (ref 6.0–8.3)

## 2022-06-19 LAB — CBC WITH DIFFERENTIAL/PLATELET
Basophils Absolute: 0.1 10*3/uL (ref 0.0–0.1)
Basophils Relative: 0.9 % (ref 0.0–3.0)
Eosinophils Absolute: 0.3 10*3/uL (ref 0.0–0.7)
Eosinophils Relative: 5.6 % — ABNORMAL HIGH (ref 0.0–5.0)
HCT: 40.1 % (ref 39.0–52.0)
Hemoglobin: 13.5 g/dL (ref 13.0–17.0)
Lymphocytes Relative: 35.8 % (ref 12.0–46.0)
Lymphs Abs: 2.2 10*3/uL (ref 0.7–4.0)
MCHC: 33.8 g/dL (ref 30.0–36.0)
MCV: 87.3 fl (ref 78.0–100.0)
Monocytes Absolute: 0.6 10*3/uL (ref 0.1–1.0)
Monocytes Relative: 9.6 % (ref 3.0–12.0)
Neutro Abs: 3 10*3/uL (ref 1.4–7.7)
Neutrophils Relative %: 48.1 % (ref 43.0–77.0)
Platelets: 273 10*3/uL (ref 150.0–400.0)
RBC: 4.59 Mil/uL (ref 4.22–5.81)
RDW: 14.1 % (ref 11.5–15.5)
WBC: 6.1 10*3/uL (ref 4.0–10.5)

## 2022-06-19 LAB — LIPID PANEL
Cholesterol: 144 mg/dL (ref 0–200)
HDL: 45.4 mg/dL (ref >39.00)
LDL Cholesterol: 79 mg/dL (ref 0–99)
NonHDL: 98.41
Total CHOL/HDL Ratio: 3
Triglycerides: 95 mg/dL (ref 0.0–149.0)
VLDL: 19 mg/dL (ref 0.0–40.0)

## 2022-06-19 LAB — BASIC METABOLIC PANEL
BUN: 10 mg/dL (ref 6–23)
CO2: 28 mEq/L (ref 19–32)
Calcium: 9.4 mg/dL (ref 8.4–10.5)
Chloride: 99 mEq/L (ref 96–112)
Creatinine, Ser: 0.95 mg/dL (ref 0.40–1.50)
GFR: 90.03 mL/min (ref >60.00)
Glucose, Bld: 85 mg/dL (ref 70–99)
Potassium: 4 mEq/L (ref 3.5–5.1)
Sodium: 134 mEq/L — ABNORMAL LOW (ref 135–145)

## 2022-06-19 LAB — PSA: PSA: 0.51 ng/mL (ref 0.10–4.00)

## 2022-06-19 LAB — HEMOGLOBIN A1C: Hgb A1c MFr Bld: 5.7 % (ref 4.6–6.5)

## 2022-06-19 LAB — TSH: TSH: 2.14 u[IU]/mL (ref 0.35–5.50)

## 2022-06-19 MED ORDER — ROSUVASTATIN CALCIUM 5 MG PO TABS: 5.0000 mg | ORAL_TABLET | Freq: Every day | ORAL | 3 refills | Status: DC

## 2022-06-19 MED ORDER — ALPRAZOLAM 0.5 MG PO TABS: 0.5000 mg | ORAL_TABLET | Freq: Two times a day (BID) | ORAL | 2 refills | Status: DC | PRN

## 2022-06-19 MED ORDER — ESCITALOPRAM OXALATE 10 MG PO TABS: 10.0000 mg | ORAL_TABLET | Freq: Every day | ORAL | 2 refills | Status: DC

## 2022-06-19 NOTE — Progress Notes (Signed)
Subjective:    Patient ID: Jackson Smith, male    DOB: Dec 21, 1965, 56 y.o.   MRN: [id number]  HPI Here for a well exam. He only has one concern today and that is anxiety. He has used Xanax intermittently for years, but in the past few months he has felt more anxiety than ever before. 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 He sleeps well. He denies any depression symptoms. Sometimes he feels like there is a tightness in his throat and chest, and this is relieved quickly by taking a Xanax.    Review of Systems  Constitutional: Negative.   HENT: Negative.    Eyes: Negative.   Respiratory: Negative.    Cardiovascular: Negative.   Gastrointestinal: Negative.   Genitourinary: Negative.   Musculoskeletal: Negative.   Skin: Negative.   Neurological: Negative.   Psychiatric/Behavioral:  Negative for agitation, behavioral problems, confusion, decreased concentration, dysphoric mood, hallucinations, self-injury, sleep disturbance and suicidal ideas. The patient is nervous/anxious.        Objective:   Physical Exam Constitutional:      General: He is not in acute distress.    Appearance: Normal appearance. He is well-developed. He is not diaphoretic.  HENT:     Head: Normocephalic and atraumatic.     Right Ear: External ear normal.     Left Ear: External ear normal.     Nose: Nose normal.     Mouth/Throat:     Pharynx: No oropharyngeal exudate.  Eyes:     General: No scleral icterus.       Right eye: No discharge.        Left eye: No discharge.     Conjunctiva/sclera: Conjunctivae normal.     Pupils: Pupils are equal, round, and reactive to light.  Neck:     Thyroid: No thyromegaly.     Vascular: No JVD.     Trachea: No tracheal deviation.  Cardiovascular:     Rate and Rhythm: Normal rate and regular rhythm.     Heart sounds: Normal heart sounds. No murmur  heard.    No friction rub. No gallop.  Pulmonary:     Effort: Pulmonary effort is normal. No respiratory distress.     Breath sounds: Normal breath sounds. No wheezing or rales.  Chest:     Chest wall: No tenderness.  Abdominal:     General: Bowel sounds are normal. There is no distension.     Palpations: Abdomen is soft. There is no mass.     Tenderness: There is no abdominal tenderness. There is no guarding or rebound.  Genitourinary:    Penis: Normal. No tenderness.      Testes: Normal.     Prostate: Normal.     Rectum: Normal. Guaiac result negative.  Musculoskeletal:        General: No tenderness. Normal range of motion.     Cervical back: Neck supple.  Lymphadenopathy:     Cervical: No cervical adenopathy.  Skin:    General: Skin is warm and dry.     Coloration: Skin is not pale.     Findings: No erythema or rash.  Neurological:     Mental Status: He is alert and oriented to person, place, and time.     Cranial Nerves: No cranial nerve deficit.     Motor: No abnormal muscle tone.  Coordination: Coordination normal.     Deep Tendon Reflexes: Reflexes are normal and symmetric. Reflexes normal.  Psychiatric:        Mood and Affect: Mood normal.        Behavior: Behavior normal.        Thought Content: Thought content normal.           Assessment & Plan:  Well exam. We discussed diet and exercise. Get fasting labs. He is dealing with a lot of anxiety, so he will start on Lexapro 10 mg daily. He may add a Xanax as needed. Follow up in 4 weeks.  Alysia Penna, MD

## 2022-06-28 ENCOUNTER — Emergency Department (HOSPITAL_BASED_OUTPATIENT_CLINIC_OR_DEPARTMENT_OTHER): Payer: 59 | Admitting: Radiology

## 2022-06-28 ENCOUNTER — Other Ambulatory Visit: Payer: Self-pay

## 2022-06-28 ENCOUNTER — Emergency Department (HOSPITAL_BASED_OUTPATIENT_CLINIC_OR_DEPARTMENT_OTHER)
Admission: EM | Admit: 2022-06-28 | Discharge: 2022-06-28 | Disposition: A | Discharge status: Home / Self Care | Payer: 59 | Attending: Emergency Medicine | Admitting: Emergency Medicine

## 2022-06-28 ENCOUNTER — Encounter (HOSPITAL_BASED_OUTPATIENT_CLINIC_OR_DEPARTMENT_OTHER): Payer: Self-pay

## 2022-06-28 ENCOUNTER — Ambulatory Visit: Payer: 59 | Admitting: Family Medicine

## 2022-06-28 DIAGNOSIS — R0789 Other chest pain: Secondary | ICD-10-CM | POA: Diagnosis present

## 2022-06-28 LAB — TROPONIN I (HIGH SENSITIVITY)
Troponin I (High Sensitivity): <2 ng/L (ref <18)
Troponin I (High Sensitivity): 2 ng/L (ref <18)

## 2022-06-28 LAB — BASIC METABOLIC PANEL
Anion gap: 11 (ref 5–15)
BUN: 14 mg/dL (ref 6–20)
CO2: 25 mmol/L (ref 22–32)
Calcium: 9.5 mg/dL (ref 8.9–10.3)
Chloride: 99 mmol/L (ref 98–111)
Creatinine, Ser: 0.86 mg/dL (ref 0.61–1.24)
GFR, Estimated: >60 mL/min (ref >60)
Glucose, Bld: 108 mg/dL — ABNORMAL HIGH (ref 70–99)
Potassium: 4.3 mmol/L (ref 3.5–5.1)
Sodium: 135 mmol/L (ref 135–145)

## 2022-06-28 LAB — CBC
HCT: 40 % (ref 39.0–52.0)
Hemoglobin: 13.5 g/dL (ref 13.0–17.0)
MCH: 29.1 pg (ref 26.0–34.0)
MCHC: 33.8 g/dL (ref 30.0–36.0)
MCV: 86.2 fL (ref 80.0–100.0)
Platelets: 304 10*3/uL (ref 150–400)
RBC: 4.64 MIL/uL (ref 4.22–5.81)
RDW: 13.4 % (ref 11.5–15.5)
WBC: 5.5 10*3/uL (ref 4.0–10.5)
nRBC: 0 % (ref 0.0–0.2)

## 2022-06-28 NOTE — Discharge Instructions (Signed)
Your work-up today was very reassuring.  Your heart enzyme was normal as well as your EKG, chest x-ray and the rest of your labs.  It is possible that your symptoms are secondary to flareups of your acid reflux or your anxiety.  Follow-up with your PCP as needed.  Return if symptoms significantly worsen.

## 2022-06-28 NOTE — ED Provider Notes (Signed)
[facility] EMERGENCY DEPT Provider Note   CSN: [id number] Arrival date & time: 06/28/22  1253     History  Chief Complaint  Patient presents with   Chest Pain    Jackson Smith is a 56 y.o. male with history of factor V, acid reflux, anxiety and hyperlipidemia who presents to the emergency department for evaluation of midsternal chest pain that has been ongoing for 2 to 3 days.  Pain has been intermittent and he describes it as a "soreness".  He states pain is worse when he bends over but is otherwise nonpositional.  He states occasionally he feels the pain radiates up the sternum and just below his left breast. he initially believed his symptoms were related to his acid reflux as it was responsive to Prilosec, however pain has been coming and going and he has concerned that this could be related to his heart.  Patient is a non-smoker and denies recent surgery, travel.  He denies cough, shortness of breath, abdominal pain, nausea, vomiting, diarrhea, leg swelling or palpitations.  Patient also notes that he recently started Lexapro about 1 week ago.   Chest Pain Associated symptoms: no abdominal pain, no cough, no palpitations, no shortness of breath and no vomiting        Home Medications Prior to Admission medications   Medication Sig Start Date End Date Taking? Authorizing Provider  acetaminophen (TYLENOL) 500 MG tablet Take 1,000 mg by mouth daily as needed for headache.    [provider]  ALPRAZolam Duanne Moron) 0.5 MG tablet Take 1 tablet (0.5 mg total) by mouth 2 (two) times daily as needed for anxiety. 06/19/22   Laurey Morale, MD  escitalopram (LEXAPRO) 10 MG tablet Take 1 tablet (10 mg total) by mouth daily. 06/19/22   Laurey Morale, MD  fexofenadine-pseudoephedrine (ALLEGRA-D 24) 180-240 MG per 24 hr tablet Take 1 tablet by mouth daily as needed (seasonal allergies).     [provider]  Omega-3 Fatty Acids (FISH OIL PO) Take 1 capsule by  mouth daily.    [provider]  Phenyleph-Doxyl-DM-Aspirin (ALKA-SELTZER PLUS NIGHT COLD) 7.8-6.25-10-500 MG TBEF Take 2 tablets by mouth at bedtime as needed (aches and pains).    [provider]  rosuvastatin (CRESTOR) 5 MG tablet Take 1 tablet (5 mg total) by mouth daily. 06/19/22   Laurey Morale, MD  triamcinolone cream (KENALOG) 0.1 % Apply 1 application topically 2 (two) times daily as needed. 12/23/21   Burchette, Alinda Sierras, MD      Allergies    Lipitor [atorvastatin], Cashew nut oil, and Penicillins    Review of Systems   Review of Systems  Respiratory:  Negative for cough and shortness of breath.   Cardiovascular:  Positive for chest pain. Negative for palpitations and leg swelling.  Gastrointestinal:  Negative for abdominal pain and vomiting.    Physical Exam Updated Vital Signs BP 121/77 (BP Location: Right Arm)   Pulse 62   Temp 98.4 F (36.9 C)   Resp 13   Ht 5' 8.5" (1.74 m)   Wt 86.6 kg   SpO2 100%   BMI 28.61 kg/m  Physical Exam Vitals and nursing note reviewed.  Constitutional:      General: He is not in acute distress.    Appearance: He is not ill-appearing.  HENT:     Head: Atraumatic.  Eyes:     Conjunctiva/sclera: Conjunctivae normal.  Neck:     Vascular: No JVD.  Cardiovascular:  Rate and Rhythm: Normal rate and regular rhythm.     Pulses: Normal pulses.          Radial pulses are 2+ on the right side and 2+ on the left side.       Dorsalis pedis pulses are 2+ on the right side and 2+ on the left side.     Heart sounds: No murmur heard. Pulmonary:     Effort: Pulmonary effort is normal. No respiratory distress.     Breath sounds: Normal breath sounds.  Abdominal:     General: Abdomen is flat. There is no distension.     Palpations: Abdomen is soft.     Tenderness: There is no abdominal tenderness.  Musculoskeletal:        General: Normal range of motion.     Cervical back: Normal range of motion.     Right lower leg: No  edema.     Left lower leg: No edema.  Skin:    General: Skin is warm and dry.     Capillary Refill: Capillary refill takes less than 2 seconds.  Neurological:     General: No focal deficit present.     Mental Status: He is alert.  Psychiatric:        Mood and Affect: Mood normal.     ED Results / Procedures / Treatments   Labs (all labs ordered are listed, but only abnormal results are displayed) Labs Reviewed  BASIC METABOLIC PANEL - Abnormal; Notable for the following components:      Result Value   Glucose, Bld 108 (*)    All other components within normal limits  CBC  TROPONIN I (HIGH SENSITIVITY)  TROPONIN I (HIGH SENSITIVITY)    EKG None  Radiology DG Chest 2 View  Result Date: 06/28/2022 CLINICAL DATA:  Chest pain EXAM: CHEST - 2 VIEW COMPARISON:  07/05/2020 FINDINGS: The heart size and mediastinal contours are within normal limits. Both lungs are clear. The visualized skeletal structures are unremarkable. IMPRESSION: No active cardiopulmonary disease. Electronically Signed   By: Elmer Picker M.D.   On: 06/28/2022 13:46    Procedures Procedures    Medications Ordered in ED Medications - No data to display  ED Course/ Medical Decision Making/ A&P           HEART Score: 2                Medical Decision Making Amount and/or Complexity of Data Reviewed Labs: ordered. Radiology: ordered.   Social determinants of health:  Social History   Socioeconomic History   Marital status: Single    Spouse name: Not on file   Number of children: 0   Years of education: Not on file   Highest education level: Bachelor's degree (e.g., BA, AB, BS)  Occupational History    Employer: [employer]  Tobacco Use   Smoking status: Never   Smokeless tobacco: Never  Vaping Use   Vaping Use: Never used  Substance and Sexual Activity   Alcohol use: No    Alcohol/week: 0.0 standard drinks of alcohol   Drug use: No   Sexual activity: Not on file  Other  Topics Concern   Not on file  Social History Narrative   Not on file   Social Determinants of Health   Financial Resource Strain: Low Risk  (12/21/2021)   Overall Financial Resource Strain (CARDIA)    Difficulty of Paying Living Expenses: Not hard at all  Food Insecurity: No  Food Insecurity (12/21/2021)   Hunger Vital Sign    Worried About Running Out of Food in the Last Year: Never true    Ran Out of Food in the Last Year: Never true  Transportation Needs: No Transportation Needs (12/21/2021)   PRAPARE - Hydrologist (Medical): No    Lack of Transportation (Non-Medical): No  Physical Activity: Unknown (12/21/2021)   Exercise Vital Sign    Days of Exercise per Week: Patient refused    Minutes of Exercise per Session: Not on file  Stress: No Stress Concern Present (12/21/2021)   [place]    Feeling of Stress : Only a little  Social Connections: Unknown (12/21/2021)   Social Connection and Isolation Panel [NHANES]    Frequency of Communication with Friends and Family: More than three times a week    Frequency of Social Gatherings with Friends and Family: More than three times a week    Attends Religious Services: Patient refused    Active Member of Clubs or Organizations: Patient refused    Attends Archivist Meetings: Not on file    Marital Status: Never married  Intimate Partner Violence: Not on file     Initial impression:  This patient presents to the ED for concern of midsternal chest pain that has been intermittent for 2 to 3 days, this involves an extensive number of treatment options, and is a complaint that carries with it a high risk of complications and morbidity.   Differentials include ACS, anxiety, pneumonia, costochondritis, acid reflux.   Comorbidities affecting care:  Per HPI  Additional history obtained: Family medicine records  Lab Tests  I Ordered, reviewed,  and interpreted labs and EKG.  The pertinent results include:  BMP and CBC unremarkable  Imaging Studies ordered:  I ordered imaging studies including  Chest x-ray normal I independently visualized and interpreted imaging and I agree with the radiologist interpretation.   EKG: Normal sinus rhythm  Cardiac Monitoring:  The patient was maintained on a cardiac monitor.  I personally viewed and interpreted the cardiac monitored which showed an underlying rhythm of: Sinus rhythm   ED Course/Re-evaluation: Presents in no acute distress and is nontoxic-appearing.  Vitals are without significant abnormality.  Lungs CTA bilaterally, heart sounds are normal.  He is satting well at 98% on room air.  No JVD or lower extremity edema.  He has got a heart score of 2 which considered low risk.  Labs and delta troponins normal.  Symptoms most likely related to worsening symptoms of gastric reflux.  Offered GI cocktail in the emergency department but patient does not wish to wait and just wanted to ensure that his heart was okay.  Advised to continue taking his antacids and follow-up with his PCP.  We discussed strict return precautions.  Patient is understanding and amenable to plan.  Disposition:  After consideration of the diagnostic results, physical exam, history and the patients response to treatment feel that the patent would benefit from discharge.   Atypical chest pain: Plan and management as described above. Discharged home in good condition.  Final Clinical Impression(s) / ED Diagnoses Final diagnoses:  Atypical chest pain    Rx / DC Orders ED Discharge Orders     None         Tonye Pearson, PA-C 06/28/22 Gaylord Shih, MD 07/04/22 [phone]

## 2022-06-28 NOTE — ED Notes (Signed)
Pt states CP started om Monday non radiating.  Relieved with Prilosec on that day NAD at present time

## 2022-06-28 NOTE — ED Triage Notes (Signed)
Patient here POV from Home.  Endorses CP that began Monday. Mid Chest and Non-Radiating. Somewhat Different with Deep Inspiration in Left Lower Chest.   Patient believes it was GERD as it was initially responsive to Prilosec.  No Fevers. No SOB. No N/V/D.   NAD Noted during Triage. A&Ox4. GCS 15. Ambulatory.

## 2022-07-24 ENCOUNTER — Encounter: Payer: Self-pay | Admitting: Family Medicine

## 2022-07-24 ENCOUNTER — Ambulatory Visit (INDEPENDENT_AMBULATORY_CARE_PROVIDER_SITE_OTHER): Payer: 59 | Admitting: Family Medicine

## 2022-07-24 ENCOUNTER — Other Ambulatory Visit: Payer: Self-pay

## 2022-07-24 VITALS — BP 102/72 | HR 65 | Temp 98.4°F | Wt 189.1 lb

## 2022-07-24 DIAGNOSIS — F411 Generalized anxiety disorder: Secondary | ICD-10-CM | POA: Diagnosis not present

## 2022-07-24 MED ORDER — ESCITALOPRAM OXALATE 10 MG PO TABS: 10.0000 mg | ORAL_TABLET | Freq: Every day | ORAL | 3 refills | Status: DC

## 2022-07-24 MED ORDER — TRIAMCINOLONE ACETONIDE 0.1 % EX CREA: 1.0000 | TOPICAL_CREAM | Freq: Two times a day (BID) | CUTANEOUS | 2 refills | Status: AC | PRN

## 2022-07-24 NOTE — Progress Notes (Signed)
   Subjective:    Patient ID: Jackson Smith, male    DOB: December 13, 1966, 56 y.o.   MRN: [id number]  HPI Here to follow up on anxiety. Several weeks ago he started on Lexapro 10 mg daily, and this has worked quite well for him. He feels ore relaxed and he does not stress about things the way he did before. No side effects to report.    Review of Systems  Constitutional: Negative.   Respiratory: Negative.    Cardiovascular: Negative.   Psychiatric/Behavioral: Negative.         Objective:   Physical Exam Constitutional:      Appearance: Normal appearance.  Cardiovascular:     Rate and Rhythm: Normal rate and regular rhythm.     Pulses: Normal pulses.     Heart sounds: Normal heart sounds.  Pulmonary:     Effort: Pulmonary effort is normal.     Breath sounds: Normal breath sounds.  Neurological:     Mental Status: He is alert.  Psychiatric:        Mood and Affect: Mood normal.        Behavior: Behavior normal.        Thought Content: Thought content normal.           Assessment & Plan:  His anxiety is now well controlled. We will stay on this regimen. Follow up in 3 months.  Alysia Penna, MD

## 2022-08-28 ENCOUNTER — Telehealth: Payer: Self-pay | Admitting: Family Medicine

## 2022-08-28 DIAGNOSIS — L409 Psoriasis, unspecified: Secondary | ICD-10-CM

## 2022-08-28 NOTE — Telephone Encounter (Signed)
Remind me what the problem was that we discussed

## 2022-08-28 NOTE — Telephone Encounter (Signed)
Sent a MyChart message regarding what dermatology issues pt is having so he can place referral

## 2022-08-28 NOTE — Telephone Encounter (Signed)
Pt requesting referral to dermatologist discussed at appointment on 07/24/22

## 2022-08-29 NOTE — Telephone Encounter (Signed)
I did the referral 

## 2022-09-27 ENCOUNTER — Encounter: Payer: Self-pay | Admitting: Family Medicine

## 2022-09-27 ENCOUNTER — Telehealth (INDEPENDENT_AMBULATORY_CARE_PROVIDER_SITE_OTHER): Payer: 59 | Admitting: Family Medicine

## 2022-09-27 DIAGNOSIS — F411 Generalized anxiety disorder: Secondary | ICD-10-CM

## 2022-09-27 NOTE — Progress Notes (Signed)
   Subjective:    Patient ID: Jackson Smith, male    DOB: 08/04/1966, 56 y.o.   MRN: [id number]  HPI Virtual Visit via Telephone Note  I connected with the patient on 09/27/22 at  3:45 PM EDT by telephone and verified that I am speaking with the correct person using two identifiers.   I discussed the limitations, risks, security and privacy concerns of performing an evaluation and management service by telephone and the availability of in person appointments. I also discussed with the patient that there may be a patient responsible charge related to this service. The patient expressed understanding and agreed to proceed.  Location patient: home Location provider: work or home office Participants present for the call: patient, provider Patient did not have a visit in the prior 7 days to address this/these issue(s).   History of Present Illness: Here to ask advice about stopping Lexapro. We started him on 10 mg daily about 9 weeks ago for some anxiety. He says it helped for the first month, then some of the anxiety returned. Now he says he does not want to take anything for this.    Observations/Objective: Patient sounds cheerful and well on the phone. I do not appreciate any SOB. Speech and thought processing are grossly intact. Patient reported vitals:  Assessment and Plan: Anxiety. To taper off the Lexapro he will take 1/2 a tablet (5 mg) daily for 2 weeks, then take 1/2 tablet every other day for 2 weeks, then stop. Recheck as needed. Alysia Penna, MD   Follow Up Instructions:     [phone] 11-20 [phone] 21-30 I did not refer this patient for an OV in the next 24 hours for this/these issue(s).  I discussed the assessment and treatment plan with the patient. The patient was provided an opportunity to ask questions and all were answered. The patient agreed with the plan and demonstrated an understanding of the instructions.   The patient was advised to call back  or seek an in-person evaluation if the symptoms worsen or if the condition fails to improve as anticipated.  I provided 14 minutes of non-face-to-face time during this encounter.   Alysia Penna, MD     Review of Systems     Objective:   Physical Exam        Assessment & Plan:

## 2022-10-03 ENCOUNTER — Telehealth (INDEPENDENT_AMBULATORY_CARE_PROVIDER_SITE_OTHER): Payer: 59 | Admitting: Family Medicine

## 2022-10-03 ENCOUNTER — Encounter: Payer: Self-pay | Admitting: Family Medicine

## 2022-10-03 DIAGNOSIS — F411 Generalized anxiety disorder: Secondary | ICD-10-CM | POA: Diagnosis not present

## 2022-10-03 MED ORDER — ESCITALOPRAM OXALATE 10 MG PO TABS: 20.0000 mg | ORAL_TABLET | Freq: Every day | ORAL | 3 refills | Status: DC

## 2022-10-03 NOTE — Progress Notes (Signed)
Subjective:    Patient ID: Jackson Smith, male    DOB: 22-Sep-1966, 56 y.o.   MRN: [id number]  HPI Virtual Visit via Video Note  I connected with the patient on 10/03/22 at  4:00 PM EDT by a video enabled telemedicine application and verified that I am speaking with the correct person using two identifiers.  Location patient: home Location provider:work or home office Persons participating in the virtual visit: patient, provider  I discussed the limitations of evaluation and management by telemedicine and the availability of in person appointments. The patient expressed understanding and agreed to proceed.   HPI: Here to follow up on anxiety. At our last visit he decided he wanted to come off Lexapro, so we devised a taper regimen. However after taking 1/2 tablet a day for several days, he felt his anxiety building up again. He says he realized that this was doing him more good than he thought, so he went back to taking a whole 10 mg pill a day. Now he asks of he can increase the dose because he still feels a good amount of anxiety.    ROS: See pertinent positives and negatives per HPI.  Past Medical History:  Diagnosis Date   Allergy    seasonal   Anemia    low iron   Anxiety    panic attack   Cholecystitis with cholelithiasis 01/10/2019   Constipation    Factor V Leiden mutation ([place]) 11/30/2012   Frozen shoulder March 2015   right side, saw Dr. Fredna Dow, resolved with PT    GERD (gastroesophageal reflux disease)    Hemorrhoid     Past Surgical History:  Procedure Laterality Date   ANKLE FRACTURE SURGERY     CHOLECYSTECTOMY N/A 01/10/2019   Procedure: LAPAROSCOPIC CHOLECYSTECTOMY WITH attempted INTRAOPERATIVE CHOLANGIOGRAM;  Surgeon: Fanny Skates, MD;  Location: [facility];  Service: General;  Laterality: N/A;   COLONOSCOPY  12/08/2016   per Dr. Havery Moros, benign polyps, repeat in 64 yrs    [place]  2010   decreased blood flow      WISDOM TOOTH EXTRACTION     wrist sprain     right    Family History  Problem Relation Age of Onset   Diabetes Father    Heart attack Father    Kidney failure Father    Birth defects Brother    Factor V Leiden deficiency Brother    Factor V Leiden deficiency Mother    Breast cancer Sister    Factor V Leiden deficiency Maternal Aunt    Colon cancer Maternal Aunt    Cancer Maternal Uncle 60       colon cancer   Colon cancer Maternal Uncle 60       60's     Current Outpatient Medications:    acetaminophen (TYLENOL) 500 MG tablet, Take 1,000 mg by mouth daily as needed for headache., Disp: , Rfl:    ALPRAZolam (XANAX) 0.5 MG tablet, Take 1 tablet (0.5 mg total) by mouth 2 (two) times daily as needed for anxiety., Disp: 60 tablet, Rfl: 2   fexofenadine-pseudoephedrine (ALLEGRA-D 24) 180-240 MG per 24 hr tablet, Take 1 tablet by mouth daily as needed (seasonal allergies). , Disp: , Rfl:    Omega-3 Fatty Acids (FISH OIL PO), Take 1 capsule by mouth daily., Disp: , Rfl:    Phenyleph-Doxyl-DM-Aspirin (ALKA-SELTZER PLUS NIGHT COLD) 7.8-6.25-10-500 MG TBEF, Take 2 tablets by mouth at bedtime as needed (aches  and pains)., Disp: , Rfl:    rosuvastatin (CRESTOR) 5 MG tablet, Take 1 tablet (5 mg total) by mouth daily., Disp: 90 tablet, Rfl: 3   triamcinolone cream (KENALOG) 0.1 %, Apply 1 Application topically 2 (two) times daily as needed., Disp: 30 g, Rfl: 2   escitalopram (LEXAPRO) 10 MG tablet, Take 2 tablets (20 mg total) by mouth daily., Disp: 90 tablet, Rfl: 3  EXAM:  VITALS per patient if applicable:  GENERAL: alert, oriented, appears well and in no acute distress  HEENT: atraumatic, conjunttiva clear, no obvious abnormalities on inspection of external nose and ears  NECK: normal movements of the head and neck  LUNGS: on inspection no signs of respiratory distress, breathing rate appears normal, no obvious gross SOB, gasping or wheezing  CV: no obvious cyanosis  MS: moves all  visible extremities without noticeable abnormality  PSYCH/NEURO: pleasant and cooperative, no obvious depression or anxiety, speech and thought processing grossly intact  ASSESSMENT AND PLAN: Anxiety. We will increase the Lexapro to taking 2 tablets (total of 20 mg) daily. He will report back in a few weeks.  Alysia Penna, MD  Discussed the following assessment and plan:  No diagnosis found.     I discussed the assessment and treatment plan with the patient. The patient was provided an opportunity to ask questions and all were answered. The patient agreed with the plan and demonstrated an understanding of the instructions.   The patient was advised to call back or seek an in-person evaluation if the symptoms worsen or if the condition fails to improve as anticipated.      Review of Systems     Objective:   Physical Exam        Assessment & Plan:

## 2022-10-18 ENCOUNTER — Encounter: Payer: Self-pay | Admitting: Family Medicine

## 2022-10-19 ENCOUNTER — Other Ambulatory Visit: Payer: Self-pay

## 2022-10-19 DIAGNOSIS — F411 Generalized anxiety disorder: Secondary | ICD-10-CM

## 2022-10-19 MED ORDER — ESCITALOPRAM OXALATE 20 MG PO TABS: 20.0000 mg | ORAL_TABLET | Freq: Every day | ORAL | 3 refills | Status: DC

## 2022-10-19 NOTE — Telephone Encounter (Signed)
I agree. Stop the 10 mg and call in Lexapro 20 mg daily, #90 with 3 rf

## 2022-11-14 ENCOUNTER — Encounter: Payer: Self-pay | Admitting: Family Medicine

## 2022-11-15 NOTE — Telephone Encounter (Signed)
No this should not hurt him. He can resume his normal daily schedule

## 2023-02-02 ENCOUNTER — Encounter: Payer: Self-pay | Admitting: Family Medicine

## 2023-02-02 ENCOUNTER — Ambulatory Visit (INDEPENDENT_AMBULATORY_CARE_PROVIDER_SITE_OTHER): Payer: 59 | Admitting: Family Medicine

## 2023-02-02 VITALS — BP 100/80 | HR 70 | Temp 98.2°F | Ht 68.5 in | Wt 191.0 lb

## 2023-02-02 DIAGNOSIS — B349 Viral infection, unspecified: Secondary | ICD-10-CM

## 2023-02-02 DIAGNOSIS — R6889 Other general symptoms and signs: Secondary | ICD-10-CM | POA: Diagnosis not present

## 2023-02-02 LAB — POC COVID19 BINAXNOW: SARS Coronavirus 2 Ag: NEGATIVE

## 2023-02-02 LAB — POCT INFLUENZA A/B
Influenza A, POC: NEGATIVE
Influenza B, POC: NEGATIVE

## 2023-02-02 LAB — POCT RAPID STREP A (OFFICE): Rapid Strep A Screen: NEGATIVE

## 2023-02-02 MED ORDER — ALPRAZOLAM 0.5 MG PO TABS
0.5000 mg | ORAL_TABLET | Freq: Two times a day (BID) | ORAL | 5 refills | Status: DC | PRN
Start: 2023-02-02 — End: 2024-06-23

## 2023-02-02 NOTE — Progress Notes (Signed)
   Subjective:    Patient ID: Jackson Smith, male    DOB: April 02, 1966, 57 y.o.   MRN: [id number]  HPI Here for 4 days of body aches, chills, fatigue, ST, and runny nose. No fever or headache or cough or SOB or NVD.drinking fluids and taking Allegra D.    Review of Systems  Constitutional:  Positive for chills and fatigue. Negative for fever.  HENT:  Positive for congestion, postnasal drip, rhinorrhea, sneezing and sore throat. Negative for ear pain and sinus pain.   Eyes: Negative.   Respiratory: Negative.         Objective:   Physical Exam Constitutional:      Appearance: Normal appearance.  HENT:     Right Ear: Tympanic membrane, ear canal and external ear normal.     Left Ear: Tympanic membrane, ear canal and external ear normal.     Nose: Nose normal.     Mouth/Throat:     Pharynx: Oropharynx is clear.  Eyes:     Conjunctiva/sclera: Conjunctivae normal.  Pulmonary:     Effort: Pulmonary effort is normal.     Breath sounds: Normal breath sounds.  Lymphadenopathy:     Cervical: No cervical adenopathy.  Neurological:     Mental Status: He is alert.           Assessment & Plan:  Viral illness. He will rest and drink fluids. Recheck as needed.  Alysia Penna, MD

## 2023-04-09 ENCOUNTER — Ambulatory Visit (INDEPENDENT_AMBULATORY_CARE_PROVIDER_SITE_OTHER): Payer: 59 | Admitting: Family Medicine

## 2023-04-09 ENCOUNTER — Encounter: Payer: Self-pay | Admitting: Family Medicine

## 2023-04-09 VITALS — BP 104/78 | HR 63 | Temp 98.1°F | Wt 199.0 lb

## 2023-04-09 DIAGNOSIS — L0232 Furuncle of buttock: Secondary | ICD-10-CM

## 2023-04-09 MED ORDER — DOXYCYCLINE HYCLATE 100 MG PO CAPS: 100.0000 mg | ORAL_CAPSULE | Freq: Two times a day (BID) | ORAL | 0 refills | Status: DC

## 2023-04-09 NOTE — Progress Notes (Signed)
   Subjective:    Patient ID: Jackson Smith, male    DOB: 1966/07/11, 57 y.o.   MRN: [id number]  HPI Here for a painful lump on the right upper buttock that came up a bout 4 days ago. He feels fine in general, no fever.    Review of Systems  Constitutional: Negative.   Respiratory: Negative.    Cardiovascular: Negative.        Objective:   Physical Exam Constitutional:      Appearance: Normal appearance.  Cardiovascular:     Rate and Rhythm: Normal rate and regular rhythm.     Pulses: Normal pulses.     Heart sounds: Normal heart sounds.  Pulmonary:     Effort: Pulmonary effort is normal.     Breath sounds: Normal breath sounds.  Skin:    Comments: There is a 3 cm firm mobile tender mass under the skin on the right upper buttock  Neurological:     Mental Status: He is alert.           Assessment & Plan:  Boil. Treat with 10 days of Doxycycline and hot soaks. Recehck as needed.  Gershon Crane, MD

## 2023-04-19 ENCOUNTER — Encounter: Payer: Self-pay | Admitting: Family Medicine

## 2023-04-19 ENCOUNTER — Ambulatory Visit (INDEPENDENT_AMBULATORY_CARE_PROVIDER_SITE_OTHER): Payer: 59 | Admitting: Family Medicine

## 2023-04-19 VITALS — BP 104/62 | HR 61 | Temp 97.8°F | Wt 197.0 lb

## 2023-04-19 DIAGNOSIS — L0232 Furuncle of buttock: Secondary | ICD-10-CM | POA: Diagnosis not present

## 2023-04-19 MED ORDER — DOXYCYCLINE HYCLATE 100 MG PO CAPS: 100.0000 mg | ORAL_CAPSULE | Freq: Two times a day (BID) | ORAL | 0 refills | Status: AC

## 2023-04-19 NOTE — Progress Notes (Signed)
   Subjective:    Patient ID: Jackson Smith, male    DOB: 01-14-1966, 57 y.o.   MRN: [id number]  HPI Here to follow up on a boil on the right buttock. This has been present about 3 weeks. It is mildly painful. He has taken a course of Doxycycline with no improvement.    Review of Systems  Constitutional: Negative.   Respiratory: Negative.    Cardiovascular: Negative.        Objective:   Physical Exam Constitutional:      Appearance: Normal appearance.  Cardiovascular:     Rate and Rhythm: Normal rate and regular rhythm.     Pulses: Normal pulses.     Heart sounds: Normal heart sounds.  Pulmonary:     Effort: Pulmonary effort is normal.     Breath sounds: Normal breath sounds.  Skin:    Comments: There is a large deep tender boil on the upper right buttock, no change from the last exam   Neurological:     Mental Status: He is alert.           Assessment & Plan:  Buttock boil. We will refer him to Surgery for treatment. Cover with Doxycycline in the meantime.  Gershon Crane, MD

## 2023-04-24 ENCOUNTER — Encounter: Payer: Self-pay | Admitting: Family Medicine

## 2023-05-04 NOTE — Telephone Encounter (Signed)
Noted  

## 2023-05-04 NOTE — Telephone Encounter (Signed)
Fax number was incorrect in our system. I just resent referral and the referral coordinator at [facility] informed me she would give patient a call as soon as she receive it.

## 2023-05-11 ENCOUNTER — Other Ambulatory Visit: Payer: Self-pay | Admitting: General Surgery

## 2023-05-29 ENCOUNTER — Encounter: Payer: Self-pay | Admitting: Family Medicine

## 2023-05-29 ENCOUNTER — Ambulatory Visit (INDEPENDENT_AMBULATORY_CARE_PROVIDER_SITE_OTHER): Payer: 59 | Admitting: Family Medicine

## 2023-05-29 VITALS — BP 110/78 | HR 60 | Temp 97.8°F | Wt 199.0 lb

## 2023-05-29 DIAGNOSIS — M79605 Pain in left leg: Secondary | ICD-10-CM | POA: Diagnosis not present

## 2023-05-29 NOTE — Progress Notes (Signed)
   Subjective:    Patient ID: Jackson Smith, male    DOB: 1966-05-07, 57 y.o.   MRN: [id number]  HPI Here for intermittent sharp pains in the left leg that started 3 weeks ago. He has never had these before. He describes several types of symptoms in the leg. The first thing he felt was a sharp very localized pain (he describes it as "like a needle stuck me") in the left calf. He has also felt a dull pain in the posterior left thigh and a numbness in the anterior left thigh. There has been no numbness. Each of these episodes last about 10-20 seconds, and he has had 4 or 5 of them. No back pain.   Review of Systems  Constitutional: Negative.   Respiratory: Negative.    Cardiovascular: Negative.   Musculoskeletal:  Positive for myalgias.       Objective:   Physical Exam Constitutional:      Appearance: Normal appearance. He is not ill-appearing.  Cardiovascular:     Rate and Rhythm: Normal rate and regular rhythm.     Pulses: Normal pulses.     Heart sounds: Normal heart sounds.  Pulmonary:     Effort: Pulmonary effort is normal.     Breath sounds: Normal breath sounds.  Musculoskeletal:     Comments: The lower back has no tenderness and full ROM. The left leg is normal on exam, no swelling or tenderness, no cords felt   Neurological:     Mental Status: He is alert.           Assessment & Plan:  Intermittent left leg pain which has a neurologic quality to it. We will get a venous doppler to rule out DVT. If this persists, we will investigate further.  Gershon Crane, MD

## 2023-05-31 ENCOUNTER — Ambulatory Visit (HOSPITAL_COMMUNITY)
Admission: RE | Admit: 2023-05-31 | Discharge: 2023-05-31 | Disposition: A | Discharge status: Home / Self Care | Payer: 59 | Source: Ambulatory Visit | Attending: Family Medicine | Admitting: Family Medicine

## 2023-05-31 DIAGNOSIS — M79605 Pain in left leg: Secondary | ICD-10-CM | POA: Diagnosis present

## 2023-06-08 ENCOUNTER — Encounter: Payer: Self-pay | Admitting: Family Medicine

## 2023-06-08 DIAGNOSIS — M79605 Pain in left leg: Secondary | ICD-10-CM

## 2023-06-12 NOTE — Telephone Encounter (Signed)
I ordered an MRI of the left lower leg to see what is going on

## 2023-06-19 ENCOUNTER — Ambulatory Visit
Admission: RE | Admit: 2023-06-19 | Discharge: 2023-06-19 | Disposition: A | Discharge status: Home / Self Care | Payer: 59 | Source: Ambulatory Visit | Attending: Family Medicine | Admitting: Family Medicine

## 2023-06-19 DIAGNOSIS — M79605 Pain in left leg: Secondary | ICD-10-CM

## 2023-06-19 MED ORDER — GADOPICLENOL 0.5 MMOL/ML IV SOLN
8.0000 mL | Freq: Once | INTRAVENOUS | Status: AC | PRN
  Administered 2023-06-19: 8 mL via INTRAVENOUS

## 2023-06-22 ENCOUNTER — Ambulatory Visit (INDEPENDENT_AMBULATORY_CARE_PROVIDER_SITE_OTHER): Payer: 59 | Admitting: Family Medicine

## 2023-06-22 ENCOUNTER — Encounter: Payer: Self-pay | Admitting: Family Medicine

## 2023-06-22 VITALS — BP 98/70 | HR 65 | Temp 98.2°F | Ht 68.5 in | Wt 198.0 lb

## 2023-06-22 DIAGNOSIS — Z0000 Encounter for general adult medical examination without abnormal findings: Secondary | ICD-10-CM

## 2023-06-22 LAB — CBC WITH DIFFERENTIAL/PLATELET
Basophils Absolute: 0.1 10*3/uL (ref 0.0–0.1)
Basophils Relative: 0.9 % (ref 0.0–3.0)
Eosinophils Absolute: 0.3 10*3/uL (ref 0.0–0.7)
Eosinophils Relative: 5.4 % — ABNORMAL HIGH (ref 0.0–5.0)
HCT: 40.7 % (ref 39.0–52.0)
Hemoglobin: 13.4 g/dL (ref 13.0–17.0)
Lymphocytes Relative: 35.1 % (ref 12.0–46.0)
Lymphs Abs: 2.1 10*3/uL (ref 0.7–4.0)
MCHC: 33 g/dL (ref 30.0–36.0)
MCV: 88.9 fl (ref 78.0–100.0)
Monocytes Absolute: 0.6 10*3/uL (ref 0.1–1.0)
Monocytes Relative: 10.1 % (ref 3.0–12.0)
Neutro Abs: 2.9 10*3/uL (ref 1.4–7.7)
Neutrophils Relative %: 48.5 % (ref 43.0–77.0)
Platelets: 291 10*3/uL (ref 150.0–400.0)
RBC: 4.58 Mil/uL (ref 4.22–5.81)
RDW: 14.1 % (ref 11.5–15.5)
WBC: 5.9 10*3/uL (ref 4.0–10.5)

## 2023-06-22 LAB — LIPID PANEL
Cholesterol: 151 mg/dL (ref 0–200)
HDL: 41.8 mg/dL (ref >39.00)
LDL Cholesterol: 80 mg/dL (ref 0–99)
NonHDL: 109.29
Total CHOL/HDL Ratio: 4
Triglycerides: 147 mg/dL (ref 0.0–149.0)
VLDL: 29.4 mg/dL (ref 0.0–40.0)

## 2023-06-22 LAB — TSH: TSH: 1.83 u[IU]/mL (ref 0.35–5.50)

## 2023-06-22 LAB — BASIC METABOLIC PANEL
BUN: 14 mg/dL (ref 6–23)
CO2: 23 mEq/L (ref 19–32)
Calcium: 9.3 mg/dL (ref 8.4–10.5)
Chloride: 104 mEq/L (ref 96–112)
Creatinine, Ser: 0.9 mg/dL (ref 0.40–1.50)
GFR: 95.39 mL/min (ref >60.00)
Glucose, Bld: 103 mg/dL — ABNORMAL HIGH (ref 70–99)
Potassium: 4.5 mEq/L (ref 3.5–5.1)
Sodium: 134 mEq/L — ABNORMAL LOW (ref 135–145)

## 2023-06-22 LAB — HEPATIC FUNCTION PANEL
ALT: 24 U/L (ref 0–53)
AST: 27 U/L (ref 0–37)
Albumin: 4.2 g/dL (ref 3.5–5.2)
Alkaline Phosphatase: 74 U/L (ref 39–117)
Bilirubin, Direct: 0.1 mg/dL (ref 0.0–0.3)
Total Bilirubin: 0.4 mg/dL (ref 0.2–1.2)
Total Protein: 7.1 g/dL (ref 6.0–8.3)

## 2023-06-22 LAB — HEMOGLOBIN A1C: Hgb A1c MFr Bld: 5.6 % (ref 4.6–6.5)

## 2023-06-22 LAB — PSA: PSA: 0.33 ng/mL (ref 0.10–4.00)

## 2023-06-22 MED ORDER — ROSUVASTATIN CALCIUM 5 MG PO TABS: 5.0000 mg | ORAL_TABLET | Freq: Every day | ORAL | 3 refills | Status: DC

## 2023-06-22 NOTE — Progress Notes (Signed)
Subjective:    Patient ID: Jackson Smith, male    DOB: 05-20-1966, 58 y.o.   MRN: [id number]  HPI Here for a well exam. He feels well except for 2 issues. We have been evaluating a sharp pain in the left calf that comes and goes. His venous doppler was negative. He then had an MRI of the lower leg a few days ago, and the report is still pending. He had a sebaceous cyst removed on 06-07-23, and this went well    Review of Systems  Constitutional: Negative.   HENT: Negative.    Eyes: Negative.   Respiratory: Negative.    Cardiovascular: Negative.   Gastrointestinal: Negative.   Genitourinary: Negative.   Musculoskeletal: Negative.   Skin: Negative.   Neurological: Negative.   Psychiatric/Behavioral: Negative.         Objective:   Physical Exam Constitutional:      General: He is not in acute distress.    Appearance: Normal appearance. He is well-developed. He is not diaphoretic.  HENT:     Head: Normocephalic and atraumatic.     Right Ear: External ear normal.     Left Ear: External ear normal.     Nose: Nose normal.     Mouth/Throat:     Pharynx: No oropharyngeal exudate.  Eyes:     General: No scleral icterus.       Right eye: No discharge.        Left eye: No discharge.     Conjunctiva/sclera: Conjunctivae normal.     Pupils: Pupils are equal, round, and reactive to light.  Neck:     Thyroid: No thyromegaly.     Vascular: No JVD.     Trachea: No tracheal deviation.  Cardiovascular:     Rate and Rhythm: Normal rate and regular rhythm.     Pulses: Normal pulses.     Heart sounds: Normal heart sounds. No murmur heard.    No friction rub. No gallop.  Pulmonary:     Effort: Pulmonary effort is normal. No respiratory distress.     Breath sounds: Normal breath sounds. No wheezing or rales.  Chest:     Chest wall: No tenderness.  Abdominal:     General: Bowel sounds are normal. There is no distension.     Palpations: Abdomen is soft. There is no mass.      Tenderness: There is no abdominal tenderness. There is no guarding or rebound.  Genitourinary:    Penis: Normal. No tenderness.      Testes: Normal.     Prostate: Normal.     Rectum: Normal. Guaiac result negative.  Musculoskeletal:        General: No tenderness. Normal range of motion.     Cervical back: Neck supple.  Lymphadenopathy:     Cervical: No cervical adenopathy.  Skin:    General: Skin is warm and dry.     Coloration: Skin is not pale.     Findings: No erythema or rash.     Comments: His surgical wound on the superior right buttock looks clean  Neurological:     General: No focal deficit present.     Mental Status: He is alert and oriented to person, place, and time.     Cranial Nerves: No cranial nerve deficit.     Motor: No abnormal muscle tone.     Coordination: Coordination normal.     Deep Tendon Reflexes: Reflexes are normal and symmetric. Reflexes normal.  Psychiatric:  Mood and Affect: Mood normal.        Behavior: Behavior normal.        Thought Content: Thought content normal.        Judgment: Judgment normal.           Assessment & Plan:  Well exam. We discussed diet and exercise. Get fasting labs. Gershon Crane, MD

## 2023-06-28 ENCOUNTER — Telehealth: Payer: Self-pay | Admitting: Family Medicine

## 2023-06-28 NOTE — Telephone Encounter (Signed)
Pt is calling back and would like MRI result

## 2023-06-29 ENCOUNTER — Telehealth: Payer: Self-pay | Admitting: Family Medicine

## 2023-06-29 NOTE — Telephone Encounter (Signed)
Reviewed MRI results with pt, verbalized understanding

## 2023-06-29 NOTE — Telephone Encounter (Signed)
Reviewed MRI results with pt verbalized understanding

## 2023-06-29 NOTE — Telephone Encounter (Signed)
Mr Shiels is returning call from yesterday. He can be reached at [phone]

## 2023-07-13 ENCOUNTER — Encounter: Payer: Self-pay | Admitting: Family Medicine

## 2023-07-13 ENCOUNTER — Telehealth (INDEPENDENT_AMBULATORY_CARE_PROVIDER_SITE_OTHER): Payer: 59 | Admitting: Family Medicine

## 2023-07-13 DIAGNOSIS — U071 COVID-19: Secondary | ICD-10-CM | POA: Diagnosis not present

## 2023-07-13 NOTE — Progress Notes (Signed)
Virtual Visit via Video Note  I connected with Jackson Smith on 07/13/23 at 11:30 AM EDT by a video enabled telemedicine application and verified that I am speaking with the correct person using two identifiers.  Location patient: home Location provider:work or home office Persons participating in the virtual visit: patient, provider  I discussed the limitations of evaluation and management by telemedicine and the availability of in person appointments. The patient expressed understanding and agreed to proceed. Chief Complaint  Patient presents with   Covid Positive    Sore throat started getting bad this morning. Chills started on Tuesday, pushed through work tues- Thursday, but states was very exhausted after work. Took tylenol PM last night, fever of 100 last night.    HPI: Patient is a 57 year old male followed by Dr. Clent Ridges and seen for acute concern.  Patient states he started feeling sick on Tuesday night with symptoms including headache.  Patient continue working but felt exhausted.  Current symptoms include Temp >100 F last night, sore throat, decreased appetite, occasional cough.  Denies diarrhea, n/v, ear pain/pressure.  Tried tylenol pm, and other OTC cold meds.  Patient request Tylenol with codeine for sore throat or cough syrup for sleep.  ROS: See pertinent positives and negatives per HPI.  Past Medical History:  Diagnosis Date   Allergy    seasonal   Anemia    low iron   Anxiety    panic attack   Cholecystitis with cholelithiasis 01/10/2019   Constipation    Factor V Leiden mutation (HCC) 11/30/2012   Frozen shoulder March 2015   right side, saw Dr. Merlyn Lot, resolved with PT    GERD (gastroesophageal reflux disease)    Hemorrhoid     Past Surgical History:  Procedure Laterality Date   ANKLE FRACTURE SURGERY     CHOLECYSTECTOMY N/A 01/10/2019   Procedure: LAPAROSCOPIC CHOLECYSTECTOMY WITH attempted INTRAOPERATIVE CHOLANGIOGRAM;  Surgeon: Claud Kelp, MD;   Location: [facility] OR;  Service: General;  Laterality: N/A;   COLONOSCOPY  12/08/2016   per Dr. Adela Lank, benign polyps, repeat in 10 yrs    GALLBLADDER SURGERY     TESTICLE SURGERY  2010   decreased blood flow     WISDOM TOOTH EXTRACTION     wrist sprain     right    Family History  Problem Relation Age of Onset   Diabetes Father    Heart attack Father    Kidney failure Father    Birth defects Brother    Factor V Leiden deficiency Brother    Factor V Leiden deficiency Mother    Breast cancer Sister    Factor V Leiden deficiency Maternal Aunt    Colon cancer Maternal Aunt    Cancer Maternal Uncle 60       colon cancer   Colon cancer Maternal Uncle 60       60's     Current Outpatient Medications:    acetaminophen (TYLENOL) 500 MG tablet, Take 1,000 mg by mouth daily as needed for headache., Disp: , Rfl:    ALPRAZolam (XANAX) 0.5 MG tablet, Take 1 tablet (0.5 mg total) by mouth 2 (two) times daily as needed for anxiety., Disp: 60 tablet, Rfl: 5   augmented betamethasone dipropionate (DIPROLENE-AF) 0.05 % cream, SMARTSIG:Sparingly Topical Twice Daily, Disp: , Rfl:    escitalopram (LEXAPRO) 20 MG tablet, Take 1 tablet (20 mg total) by mouth daily., Disp: 90 tablet, Rfl: 3   fexofenadine-pseudoephedrine (ALLEGRA-D 24) 180-240 MG per 24 hr tablet, Take 1  tablet by mouth daily as needed (seasonal allergies). , Disp: , Rfl:    Omega-3 Fatty Acids (FISH OIL PO), Take 1 capsule by mouth daily., Disp: , Rfl:    Phenyleph-Doxyl-DM-Aspirin (ALKA-SELTZER PLUS NIGHT COLD) 7.8-6.25-10-500 MG TBEF, Take 2 tablets by mouth at bedtime as needed (aches and pains)., Disp: , Rfl:    rosuvastatin (CRESTOR) 5 MG tablet, Take 1 tablet (5 mg total) by mouth daily., Disp: 90 tablet, Rfl: 3   triamcinolone cream (KENALOG) 0.1 %, Apply 1 Application topically 2 (two) times daily as needed., Disp: 30 g, Rfl: 2  EXAM:  VITALS per patient if applicable: RR between 12-20 bpm  GENERAL: alert, oriented,  appears well and in no acute distress  HEENT: atraumatic, conjunctiva clear, no obvious abnormalities on inspection of external nose and ears  NECK: normal movements of the head and neck  LUNGS: Rare dry cough.  On inspection no signs of respiratory distress, breathing rate appears normal, no obvious gross SOB, gasping or wheezing  CV: no obvious cyanosis  MS: moves all visible extremities without noticeable abnormality  PSYCH/NEURO: pleasant and cooperative, no obvious depression or anxiety, speech and thought processing grossly intact  ASSESSMENT AND PLAN:  Discussed the following assessment and plan:  COVID-19 virus infection  Symptoms starting 3 days ago with positive COVID test today, 07/13/2023 in clinic.  Strep testing negative.  Though symptoms fairly mild discussed r/b/a of antiviral medications.  Patient declines at this time.  Discussed supportive care with OTC cough/cold medications.  Given strict precautions.  Follow-up with PCP as needed   I discussed the assessment and treatment plan with the patient. The patient was provided an opportunity to ask questions and all were answered. The patient agreed with the plan and demonstrated an understanding of the instructions.   The patient was advised to call back or seek an in-person evaluation if the symptoms worsen or if the condition fails to improve as anticipated.   Deeann Saint, MD

## 2023-09-25 ENCOUNTER — Other Ambulatory Visit: Payer: Self-pay | Admitting: Family Medicine

## 2023-09-25 DIAGNOSIS — F411 Generalized anxiety disorder: Secondary | ICD-10-CM

## 2023-11-11 ENCOUNTER — Other Ambulatory Visit: Payer: Self-pay | Admitting: Medical Genetics

## 2023-11-11 DIAGNOSIS — Z006 Encounter for examination for normal comparison and control in clinical research program: Secondary | ICD-10-CM

## 2023-12-11 ENCOUNTER — Other Ambulatory Visit (HOSPITAL_COMMUNITY)
Admission: RE | Admit: 2023-12-11 | Discharge: 2023-12-11 | Disposition: A | Discharge status: Home / Self Care | Payer: 59 | Source: Ambulatory Visit | Attending: Oncology | Admitting: Oncology

## 2023-12-11 DIAGNOSIS — Z006 Encounter for examination for normal comparison and control in clinical research program: Secondary | ICD-10-CM | POA: Insufficient documentation

## 2023-12-19 HISTORY — PX: CYST EXCISION: SHX5701

## 2023-12-22 ENCOUNTER — Other Ambulatory Visit: Payer: Self-pay | Admitting: Family Medicine

## 2023-12-22 DIAGNOSIS — F411 Generalized anxiety disorder: Secondary | ICD-10-CM

## 2023-12-24 LAB — GENECONNECT MOLECULAR SCREEN: Genetic Analysis Overall Interpretation: NEGATIVE

## 2024-02-22 ENCOUNTER — Ambulatory Visit: Admitting: Family Medicine

## 2024-02-22 VITALS — BP 136/80 | HR 75 | Resp 16 | Ht 68.5 in | Wt 205.1 lb

## 2024-02-22 DIAGNOSIS — R0789 Other chest pain: Secondary | ICD-10-CM | POA: Diagnosis not present

## 2024-02-22 DIAGNOSIS — R1310 Dysphagia, unspecified: Secondary | ICD-10-CM

## 2024-02-22 DIAGNOSIS — K219 Gastro-esophageal reflux disease without esophagitis: Secondary | ICD-10-CM

## 2024-02-22 MED ORDER — PANTOPRAZOLE SODIUM 40 MG PO TBEC
40.0000 mg | DELAYED_RELEASE_TABLET | Freq: Every day | ORAL | 0 refills | Status: DC
Start: 2024-02-22 — End: 2024-04-17

## 2024-02-22 NOTE — Progress Notes (Signed)
 ACUTE VISIT Chief Complaint  Patient presents with   Gastroesophageal Reflux    Does have history of acid reflux, feels like something is getting stuck in his throat. Having soreness at the base of throat    HPI: Jackson Smith is a 58 y.o. male with a PMHx significant for allergic rhinitis, GERD, and Factor V Leiden mutation, who is here today complaining of reflux problems as described above.  Patient complains of gradual worsening dysphagia, heartburn,and acid reflux for the past few weeks.  He also endorses some soreness in his upper chest and back, fatigue, and the feeling that something is stuck in his throat.  He has heartburn and dysphagia with both solids and liquids.  Heartburn and acid reflux interfere with sleep.  He has been taking OTC omeprazole 20 mg intermittently for awhile for acid reflux.  He has never had an upper endoscopy.  No smoking hx or current alcohol usage.   Pertinent negatives include fever, chills, abnormal weight loss, SOB, cough, blood in his stool, or black stool.  Lab Results  Component Value Date   WBC 5.9 06/22/2023   HGB 13.4 06/22/2023   HCT 40.7 06/22/2023   MCV 88.9 06/22/2023   PLT 291.0 06/22/2023   Review of Systems  Constitutional:  Negative for activity change and appetite change.  HENT:  Negative for mouth sores.   Respiratory:  Negative for wheezing and stridor.   Cardiovascular:  Negative for palpitations and leg swelling.  Gastrointestinal:  Negative for nausea and vomiting.  Genitourinary:  Negative for decreased urine volume, dysuria and hematuria.  Skin:  Negative for rash.  Neurological:  Negative for syncope, weakness and headaches.  See other pertinent positives and negatives in HPI.  Current Outpatient Medications on File Prior to Visit  Medication Sig Dispense Refill   acetaminophen (TYLENOL) 500 MG tablet Take 1,000 mg by mouth daily as needed for headache.     ALPRAZolam (XANAX) 0.5 MG tablet Take 1  tablet (0.5 mg total) by mouth 2 (two) times daily as needed for anxiety. 60 tablet 5   augmented betamethasone dipropionate (DIPROLENE-AF) 0.05 % cream SMARTSIG:Sparingly Topical Twice Daily     escitalopram (LEXAPRO) 20 MG tablet Take 1 tablet by mouth once daily 90 tablet 0   fexofenadine-pseudoephedrine (ALLEGRA-D 24) 180-240 MG per 24 hr tablet Take 1 tablet by mouth daily as needed (seasonal allergies).      Omega-3 Fatty Acids (FISH OIL PO) Take 1 capsule by mouth daily.     Phenyleph-Doxyl-DM-Aspirin (ALKA-SELTZER PLUS NIGHT COLD) 7.8-6.25-10-500 MG TBEF Take 2 tablets by mouth at bedtime as needed (aches and pains).     rosuvastatin (CRESTOR) 5 MG tablet Take 1 tablet (5 mg total) by mouth daily. 90 tablet 3   triamcinolone cream (KENALOG) 0.1 % Apply 1 Application topically 2 (two) times daily as needed. 30 g 2   No current facility-administered medications on file prior to visit.   Past Medical History:  Diagnosis Date   Allergy    seasonal   Anemia    low iron   Anxiety    panic attack   Cholecystitis with cholelithiasis 01/10/2019   Constipation    Factor V Leiden mutation (HCC) 11/30/2012   Frozen shoulder March 2015   right side, saw Dr. Merlyn Lot, resolved with PT    GERD (gastroesophageal reflux disease)    Hemorrhoid    Allergies  Allergen Reactions   Lipitor [Atorvastatin] Other (See Comments)    Myalgia  Cashew Nut Oil Rash   Penicillins Rash    Did it involve swelling of the face/tongue/throat, SOB, or low BP? No Did it involve sudden or severe rash/hives, skin peeling, or any reaction on the inside of your mouth or nose? No Did you need to seek medical attention at a hospital or doctor's office? No When did it last happen?      childhood allergy If all above answers are "NO", may proceed with cephalosporin use.     Social History   Socioeconomic History   Marital status: Single    Spouse name: Not on file   Number of children: 0   Years of education:  Not on file   Highest education level: Bachelor's degree (e.g., BA, AB, BS)  Occupational History    Employer: [employer]  Tobacco Use   Smoking status: Never   Smokeless tobacco: Never  Vaping Use   Vaping status: Never Used  Substance and Sexual Activity   Alcohol use: No    Alcohol/week: 0.0 standard drinks of alcohol   Drug use: No   Sexual activity: Not on file  Other Topics Concern   Not on file  Social History Narrative   Not on file   Social Drivers of Health   Financial Resource Strain: Low Risk  (02/22/2024)   Overall Financial Resource Strain (CARDIA)    Difficulty of Paying Living Expenses: Not hard at all  Food Insecurity: No Food Insecurity (02/22/2024)   Hunger Vital Sign    Worried About Running Out of Food in the Last Year: Never true    Ran Out of Food in the Last Year: Never true  Transportation Needs: No Transportation Needs (02/22/2024)   PRAPARE - Administrator, Civil Service (Medical): No    Lack of Transportation (Non-Medical): No  Physical Activity: Unknown (02/22/2024)   Exercise Vital Sign    Days of Exercise per Week: 0 days    Minutes of Exercise per Session: Not on file  Stress: No Stress Concern Present (02/22/2024)   [employer] of Occupational Health - Occupational Stress Questionnaire    Feeling of Stress : Only a little  Social Connections: Socially Isolated (02/22/2024)   Social Connection and Isolation Panel [NHANES]    Frequency of Communication with Friends and Family: More than three times a week    Frequency of Social Gatherings with Friends and Family: Three times a week    Attends Religious Services: Never    Active Member of Clubs or Organizations: No    Attends Banker Meetings: Not on file    Marital Status: Never married    Vitals:   02/22/24 1515  BP: 136/80  Pulse: 75  Resp: 16  SpO2: 97%   Body mass index is 30.74 kg/m.  Physical Exam Vitals and nursing note reviewed.   Constitutional:      General: He is not in acute distress.    Appearance: He is well-developed.  HENT:     Head: Normocephalic and atraumatic.     Mouth/Throat:     Mouth: Mucous membranes are moist.     Pharynx: Oropharynx is clear.  Eyes:     Conjunctiva/sclera: Conjunctivae normal.  Cardiovascular:     Rate and Rhythm: Normal rate and regular rhythm.     Heart sounds: No murmur heard. Pulmonary:     Effort: Pulmonary effort is normal. No respiratory distress.     Breath sounds: Normal breath sounds.  Abdominal:  Palpations: Abdomen is soft. There is no hepatomegaly or mass.     Tenderness: There is no abdominal tenderness.  Musculoskeletal:     Right lower leg: No edema.     Left lower leg: No edema.  Lymphadenopathy:     Cervical: No cervical adenopathy.  Skin:    General: Skin is warm.     Findings: No erythema or rash.  Neurological:     Mental Status: He is alert and oriented to person, place, and time.     Cranial Nerves: No cranial nerve deficit.     Gait: Gait normal.  Psychiatric:        Mood and Affect: Mood and affect normal.    ASSESSMENT AND PLAN:  Mr. Atienza was seen today for acid reflux.   Dysphagia, unspecified type We discussed possible causes. Problem has been going on for a while and getting worse, having problems with solids and fluids. We discussed options, which included holding on GI referral and see if symptoms resolved with changing to a different PPI. He would like GI evaluation. Instructed about warning signs.  -     Ambulatory referral to Gastroenterology  Gastroesophageal reflux disease, unspecified whether esophagitis present Problem is not well controlled. Recommend changing Omeprazole for Pantoprazole 40 mg daily. GERD precautions also recommended.  -     Pantoprazole Sodium; Take 1 tablet (40 mg total) by mouth daily.  Dispense: 60 tablet; Refill: 0  Chest discomfort We discussed possible etiologies. Most likely related  to above problems. I do not think imaging is needed at this time. Instructed about warning signs.  Return if symptoms worsen or fail to improve.  I, Rolla Etienne Wierda, acting as a scribe for Cartha Rotert [place], MD., have documented all relevant documentation on the behalf of Belicia Difatta [place], MD, as directed by  Rhianna Raulerson [place], MD while in the presence of Anina Schnake [place], MD.   I, Norita Meigs [place], MD, have reviewed all documentation for this visit. The documentation on 02/22/24 for the exam, diagnosis, procedures, and orders are all accurate and complete.  Kariss Longmire G. [place], MD  [facility]. [place] office.

## 2024-02-22 NOTE — Patient Instructions (Addendum)
 A few things to remember from today's visit:  Gastroesophageal reflux disease, unspecified whether esophagitis present - Plan: pantoprazole (PROTONIX) 40 MG tablet  Chest discomfort  Dysphagia, unspecified type - Plan: Ambulatory referral to Gastroenterology  Stop Omeprazole. Start Pantoprazole 30 min before breakfast. Chest soreness most likely related to acid reflux.  If you need refills for medications you take chronically, please call your pharmacy. Do not use My Chart to request refills or for acute issues that need immediate attention. If you send a my chart message, it may take a few days to be addressed, specially if I am not in the office.  Please be sure medication list is accurate. If a new problem present, please set up appointment sooner than planned today.

## 2024-03-03 ENCOUNTER — Encounter: Payer: Self-pay | Admitting: Physician Assistant

## 2024-03-18 ENCOUNTER — Other Ambulatory Visit: Payer: Self-pay | Admitting: Family Medicine

## 2024-03-18 DIAGNOSIS — F411 Generalized anxiety disorder: Secondary | ICD-10-CM

## 2024-04-16 ENCOUNTER — Other Ambulatory Visit: Payer: Self-pay | Admitting: Family Medicine

## 2024-04-16 DIAGNOSIS — K219 Gastro-esophageal reflux disease without esophagitis: Secondary | ICD-10-CM

## 2024-04-17 NOTE — Telephone Encounter (Signed)
 Authorizing Provider: [place], Betty G, MD  Okay to refill?

## 2024-04-24 NOTE — Progress Notes (Signed)
 Brigitte Canard, PA-C [address] [place], [place]  [address] Phone: [phone]   Gastroenterology Consultation  Referring Provider:     Donley Furth, MD Primary Care Physician:  Donley Furth, MD Primary Gastroenterologist:  Brigitte Canard, PA-C / Alvester Johnson, MD  Reason for Consultation:     Dysphagia, GERD        HPI:   Jackson Smith is a 58 y.o. y/o male referred for consultation & management  by Donley Furth, MD.    Here to evaluate dysphagia and GERD.  No previous EGD.  Patient states he feels like "liquids are going down the wrong pipe".  Occasionally gets strangled when he drinks.  Feels globus sensation like something is stuck in his throat.  Has frequent throat clearing and mild voice hoarseness.  Has episodes of mid lower chest pain which is worse if he does not eat and better if he eats.  He has had increased belching.  He has not had any difficulty swallowing solid foods.  No food bolus or vomiting episodes.  Has history of acid reflux intermittently for 3 years.  Has taken OTC Prilosec and Tums as needed in the past with benefit.  Was recently started on pantoprazole  40 Mg daily 2 weeks ago.  Belching has improved.  He denies unintentional weight loss or alarm symptoms.  No abdominal pain.  PMH: Factor V Leiden, cholecystectomy, constipation, IDA, GERD, hemorrhoids.   Last seen in our office in 2021 for constipation.  Was started on Amitiza  8 mcg twice daily.  Previously took MiraLAX  and Metamucil which caused increased bloating.  11/2016 colonoscopy by Dr. General Kenner: 2 diminutive colon mucosa polyps removed.  Good prep.  10-year repeat.  06/2023 last labs: Normal CBC, BMP, LFTs and TSH.  Hgb 13.4.  Past Medical History:  Diagnosis Date   Allergy    seasonal   Anemia    low iron   Anxiety    panic attack   Cholecystitis with cholelithiasis 01/10/2019   Constipation    Factor V Leiden mutation (HCC) 11/30/2012   Frozen shoulder March 2015    right side, saw Dr. Huntley Mai, resolved with PT    GERD (gastroesophageal reflux disease)    Hemorrhoid     Past Surgical History:  Procedure Laterality Date   ANKLE FRACTURE SURGERY     CHOLECYSTECTOMY N/A 01/10/2019   Procedure: LAPAROSCOPIC CHOLECYSTECTOMY WITH attempted INTRAOPERATIVE CHOLANGIOGRAM;  Surgeon: Boyce Byes, MD;  Location: [facility] OR;  Service: General;  Laterality: N/A;   COLONOSCOPY  12/08/2016   per Dr. General Kenner, benign polyps, repeat in 10 yrs    CYST EXCISION  2025   on back   GALLBLADDER SURGERY     TESTICLE SURGERY  2010   decreased blood flow     WISDOM TOOTH EXTRACTION     wrist sprain     right    Prior to Admission medications   Medication Sig Start Date End Date Taking? Authorizing Provider  acetaminophen  (TYLENOL ) 500 MG tablet Take 1,000 mg by mouth daily as needed for headache.    [provider]  ALPRAZolam  (XANAX ) 0.5 MG tablet Take 1 tablet (0.5 mg total) by mouth 2 (two) times daily as needed for anxiety. 02/02/23   Donley Furth, MD  augmented betamethasone  dipropionate (DIPROLENE -AF) 0.05 % cream SMARTSIG:Sparingly Topical Twice Daily 01/19/23   [provider]  escitalopram  (LEXAPRO ) 20 MG tablet Take 1 tablet by mouth once daily 03/18/24  Donley Furth, MD  fexofenadine-pseudoephedrine (ALLEGRA-D 24) 180-240 MG per 24 hr tablet Take 1 tablet by mouth daily as needed (seasonal allergies).     [provider]  Omega-3 Fatty Acids (FISH OIL PO) Take 1 capsule by mouth daily.    [provider]  pantoprazole  (PROTONIX ) 40 MG tablet Take 1 tablet by mouth once daily 04/17/24   Donley Furth, MD  Phenyleph-Doxyl-DM-Aspirin (ALKA-SELTZER PLUS NIGHT COLD) 7.8-6.25-10-500 MG TBEF Take 2 tablets by mouth at bedtime as needed (aches and pains).    [provider]  rosuvastatin  (CRESTOR ) 5 MG tablet Take 1 tablet (5 mg total) by mouth daily. 06/22/23   Donley Furth, MD  triamcinolone  cream (KENALOG ) 0.1 % Apply 1  Application topically 2 (two) times daily as needed. 07/24/22   Donley Furth, MD    Family History  Problem Relation Age of Onset   Factor V Leiden deficiency Mother    Heart disease Father    Diabetes Father    Heart attack Father    Kidney failure Father    Kidney disease Father    Breast cancer Sister    Birth defects Brother    Factor V Leiden deficiency Brother    Factor V Leiden deficiency Maternal Aunt    Colon cancer Maternal Aunt    Cancer Maternal Uncle 60       colon cancer   Colon cancer Maternal Uncle 60       60's   Esophageal cancer Neg Hx    Pancreatic cancer Neg Hx    Stomach cancer Neg Hx      Social History   Tobacco Use   Smoking status: Never   Smokeless tobacco: Never  Vaping Use   Vaping status: Never Used  Substance Use Topics   Alcohol use: No    Alcohol/week: 0.0 standard drinks of alcohol   Drug use: No    Allergies as of 04/25/2024 - Review Complete 04/25/2024  Allergen Reaction Noted   Lipitor [atorvastatin ] Other (See Comments) 06/16/2021   Cashew nut oil Rash 12/02/2012   Penicillins Rash 02/04/2008    Review of Systems:    All systems reviewed and negative except where noted in HPI.   Physical Exam:  BP 96/62   Pulse 72   Ht 5' 8.5" (1.74 m)   Wt 202 lb 4 oz (91.7 kg)   SpO2 97%   BMI 30.30 kg/m  No LMP for male patient.  General:   Alert,  Well-developed, well-nourished, pleasant and cooperative in NAD Mouth: No oral lesions.  Posterior oropharynx is clear with no erythema or rashes. Neck: No thyromegaly, enlarged lymph nodes, or masses. Lungs:  Respirations even and unlabored.  Clear throughout to auscultation.   No wheezes, crackles, or rhonchi. No acute distress. Heart:  Regular rate and rhythm; no murmurs, clicks, rubs, or gallops. Abdomen:  Normal bowel sounds.  No bruits.  Soft, and non-distended without masses, hepatosplenomegaly or hernias noted.  No Tenderness.  No guarding or rebound tenderness.    Neurologic:   Alert and oriented x3;  grossly normal neurologically. Psych:  Alert and cooperative. Normal mood and affect.  Imaging Studies: No results found.  Labs: CBC    Component Value Date/Time   WBC 5.9 06/22/2023 0839   RBC 4.58 06/22/2023 0839   HGB 13.4 06/22/2023 0839   HGB 13.8 12/02/2012 1457   HCT 40.7 06/22/2023 0839   HCT 39.7 12/02/2012 1457   PLT 291.0 06/22/2023 0839  PLT 228 12/02/2012 1457   MCV 88.9 06/22/2023 0839   MCV 88.5 12/02/2012 1457   MCH 29.1 06/28/2022 1306   MCHC 33.0 06/22/2023 0839   RDW 14.1 06/22/2023 0839   RDW 13.2 12/02/2012 1457   LYMPHSABS 2.1 06/22/2023 0839   LYMPHSABS 1.7 12/02/2012 1457   MONOABS 0.6 06/22/2023 0839   MONOABS 0.6 12/02/2012 1457   EOSABS 0.3 06/22/2023 0839   EOSABS 0.1 12/02/2012 1457   BASOSABS 0.1 06/22/2023 0839   BASOSABS 0.0 12/02/2012 1457    CMP     Component Value Date/Time   NA 134 (L) 06/22/2023 0839   K 4.5 06/22/2023 0839   CL 104 06/22/2023 0839   CO2 23 06/22/2023 0839   GLUCOSE 103 (H) 06/22/2023 0839   BUN 14 06/22/2023 0839   CREATININE 0.90 06/22/2023 0839   CREATININE 0.86 08/20/2020 0939   CALCIUM  9.3 06/22/2023 0839   PROT 7.1 06/22/2023 0839   ALBUMIN 4.2 06/22/2023 0839   AST 27 06/22/2023 0839   ALT 24 06/22/2023 0839   ALKPHOS 74 06/22/2023 0839   BILITOT 0.4 06/22/2023 0839   GFRNONAA >60 06/28/2022 1306   GFRAA >60 07/05/2020 1408    Assessment and Plan:   Jackson Smith is a 58 y.o. y/o male has been referred for symptoms most consistent with acid reflux.  1.  Dysphagia to liquids; questionable aspiration. - Scheduling modified barium swallow tablet  2.  GERD - Continue pantoprazole  40 Mg once daily. - Add famotidine 20 Mg nightly. - Recommend Lifestyle Modifications to prevent Acid Reflux.  Rec. Avoid coffee, sodas, peppermint, garlic, onions, alcohol, citrus fruits, chocolate, tomatoes, fatty and spicey foods.  Avoid eating 2-3 hours before bedtime.   - If upper  GI symptoms persist and spite of treatment, then EGD is the next step.  5.  Colon cancer screening - 10-year repeat colonoscopy will be due in 2027.  Follow up in 4 to 6 weeks to assess response to treatment.  Brigitte Canard, PA-C

## 2024-04-25 ENCOUNTER — Other Ambulatory Visit (HOSPITAL_COMMUNITY): Payer: Self-pay | Admitting: Physician Assistant

## 2024-04-25 ENCOUNTER — Ambulatory Visit (INDEPENDENT_AMBULATORY_CARE_PROVIDER_SITE_OTHER): Admitting: Physician Assistant

## 2024-04-25 ENCOUNTER — Encounter: Payer: Self-pay | Admitting: Physician Assistant

## 2024-04-25 VITALS — BP 96/62 | HR 72 | Ht 68.5 in | Wt 202.2 lb

## 2024-04-25 DIAGNOSIS — R1310 Dysphagia, unspecified: Secondary | ICD-10-CM

## 2024-04-25 DIAGNOSIS — K219 Gastro-esophageal reflux disease without esophagitis: Secondary | ICD-10-CM | POA: Diagnosis not present

## 2024-04-25 NOTE — Patient Instructions (Signed)
 You have been scheduled for a modified barium swallow on 05/09/24 at 11:30am. Please arrive 30 minutes prior to your test for registration. You will go to the Main entrance, Entr[address] (1st Floor) for your appointment. Should you need to cancel or reschedule your appointment, please contact [phone] [facility]). _____________________________________________________________________ A Modified Barium Swallow Study, or MBS, is a special x-ray that is taken to check swallowing skills. It is carried out by a Marine scientist and a Warehouse manager (SLP). During this test, yourmouth, throat, and esophagus, a muscular tube which connects your mouth to your stomach, is checked. The test will help you, your doctor, and the SLP plan what types of foods and liquids are easier for you to swallow. The SLP will also identify positions and ways to help you swallow more easily and safely. What will happen during an MBS? You will be taken to an x-ray room and seated comfortably. You will be asked to swallow small amounts of food and liquid mixed with barium. Barium is a liquid or paste that allows images of your mouth, throat and esophagus to be seen on x-ray. The x-ray captures moving images of the food you are swallowing as it travels from your mouth through your throat and into your esophagus. This test helps identify whether food or liquid is entering your lungs (aspiration). The test also shows which part of your mouth or throat lacks strength or coordination to move the food or liquid in the right direction. This test typically takes 30 minutes to 1 hour to complete. _______________________________________________________________________   Please follow up sooner if symptoms increase or worsen  Due to recent changes in healthcare laws, you may see the results of your imaging and laboratory studies on MyChart before your provider has had a chance to review them.  We understand that in some  cases there may be results that are confusing or concerning to you. Not all laboratory results come back in the same time frame and the provider may be waiting for multiple results in order to interpret others.  Please give us  48 hours in order for your provider to thoroughly review all the results before contacting the office for clarification of your results.   _______________________________________________________  If your blood pressure at your visit was 140/90 or greater, please contact your primary care physician to follow up on this.  _______________________________________________________  If you are age 69 or older, your body mass index should be between 23-30. Your Body mass index is 30.3 kg/m. If this is out of the aforementioned range listed, please consider follow up with your Primary Care Provider.  If you are age 41 or younger, your body mass index should be between 19-25. Your Body mass index is 30.3 kg/m. If this is out of the aformentioned range listed, please consider follow up with your Primary Care Provider.   ________________________________________________________  The [facility] providers would like to encourage you to use MYCHART to communicate with providers for non-urgent requests or questions.  Due to long hold times on the telephone, sending your provider a message by [facility] may be a faster and more efficient way to get a response.  Please allow 48 business hours for a response.  Please remember that this is for non-urgent requests.  _______________________________________________________ Thank you for trusting me with your gastrointestinal care!   Brigitte Canard, PA

## 2024-04-25 NOTE — Progress Notes (Signed)
 Agree with assessment and plan as outlined.

## 2024-05-02 ENCOUNTER — Telehealth: Payer: Self-pay | Admitting: Physician Assistant

## 2024-05-02 NOTE — Telephone Encounter (Signed)
 I received a call from speech-language pathologist asking me which barium swallow test we wanted.  I saw this patient for dysphagia to liquids.  I was concerned about aspiration.  I ordered a modified barium swallow test.  He was scheduled for a regular barium swallow with tablet.  Please cancel the regular barium swallow with tablet.  Instead, schedule a modified barium swallow test to evaluate for aspiration.  Diagnosis dysphagia to liquids.   Brigitte Canard, PA-C

## 2024-05-09 ENCOUNTER — Ambulatory Visit (HOSPITAL_COMMUNITY)
Admission: RE | Admit: 2024-05-09 | Discharge: 2024-05-09 | Disposition: A | Discharge status: Home / Self Care | Source: Ambulatory Visit | Attending: Family Medicine | Admitting: Family Medicine

## 2024-05-09 ENCOUNTER — Ambulatory Visit: Payer: Self-pay | Admitting: Physician Assistant

## 2024-05-09 DIAGNOSIS — R1310 Dysphagia, unspecified: Secondary | ICD-10-CM | POA: Diagnosis present

## 2024-05-09 DIAGNOSIS — R1312 Dysphagia, oropharyngeal phase: Secondary | ICD-10-CM | POA: Diagnosis not present

## 2024-05-09 DIAGNOSIS — K219 Gastro-esophageal reflux disease without esophagitis: Secondary | ICD-10-CM

## 2024-05-09 NOTE — Progress Notes (Signed)
 Modified Barium Swallow Study  Patient Details  Name: Jackson Smith MRN: [id number] Date of Birth: 05/24/1966  Today's Date: 05/09/2024  Modified Barium Swallow completed.  Full report located under Chart Review in the Imaging Section.  History of Present Illness Jackson Smith is a 58 yo male presenting for an OP MBS after complaints of "liquids going down the wrong pipe" as well as a globus sensation. He reports frequent throat clearance and mild hoarseness as well as increased eructation. PMH includes anemia, anxiety, cholecystitis with cholelithiasis, GERD   Clinical Impression Pt presents with an overall functional oropharyngeal swallow. There is mild vallecular residue which is contributed to by incomplete epiglottic inversion. This also results in consistent trace penetration which is expelled independently (PAS 2) and is considered WFL. Esophageal retention with retrograde backflow was observed when the 13 mm barium tablet was administered with thin liquids. After this occurred, pt coughed and endorsed chest discomfort. Suspect this is secondary to retrograde bolus flow. Discussed with pt and provided education regarding esophageal precautions. May consider further esophageal w/u, although ongoing SLP f/u is not indicated at this time. Factors that may increase risk of adverse event in presence of aspiration Roderick Civatte & Jessy Morocco 2021):    Swallow Evaluation Recommendations Recommendations: PO diet PO Diet Recommendation: Regular;Thin liquids (Level 0) Liquid Administration via: Cup;Straw Medication Administration: Whole meds with liquid Supervision: Patient able to self-feed Swallowing strategies  : Minimize environmental distractions;Slow rate;Small bites/sips Postural changes: Position pt fully upright for meals;Stay upright 30-60 min after meals Oral care recommendations: Oral care BID (2x/day) Recommended consults: Consider esophageal assessment    Amil Kale, M.A.,  CCC-SLP Speech Language Pathology, Acute Rehabilitation Services  Secure Chat preferred [phone]  05/09/2024,11:50 AM

## 2024-06-04 ENCOUNTER — Other Ambulatory Visit: Payer: Self-pay | Admitting: Family Medicine

## 2024-06-05 NOTE — Progress Notes (Signed)
 Ellouise Console, PA-C [address] [place], [place]  [address] Phone: [phone]   Primary Care Physician: Johnny Garnette LABOR, MD  Primary Gastroenterologist:  Ellouise Console, PA-C / Elspeth Naval, MD   Chief Complaint:  Followup GERD & Dysphagia       HPI:   Awais Cobarrubias is a 58 y.o. male returns for 6-week follow-up of dysphagia to liquids, globus sensation and frequent throat clearing with voice hoarseness.  For the past 6 weeks he has been taking pantoprazole  40 Mg every morning.  He has not been taking Famotidine .  Pantoprazole  seems to be working better than omeprazole .  Acid reflux has improved.  He has had an episode of breakthrough acid reflux at nighttime in the past week.  Felt regurgitation and acid coming up into his throat which woke him at sleep.  No recent dysphagia symptoms.  No family history of esophageal or stomach cancer.  Denies abdominal pain or weight loss.  No previous EGD.  05/09/2024 modified barium swallow test:   Clinical Impression: No aspiration.  Pt presents with an overall functional oropharyngeal swallow. There is mild vallecular residue which is contributed to by incomplete epiglottic inversion. This also results in consistent trace penetration which is expelled independently (PAS 2) and is considered WFL. Esophageal retention with retrograde backflow was observed when the 13 mm barium tablet was administered with thin liquids. After this occurred, pt coughed and reported chest discomfort. Suspect this is secondary to retrograde bolus flow. Discussed with pt and provided education regarding esophageal precautions. May consider further esophageal w/u, although ongoing SLP f/u is not indicated at this time.  Continue regular diet and thin liquids.   PMH: Factor V Leiden, cholecystectomy, constipation, IDA, GERD, hemorrhoids.  Occasionally takes OTC low-dose aspirin.  No other blood thinners.   Last seen in our office in 2021 for constipation.  Was  started on Amitiza  8 mcg twice daily.  Previously took MiraLAX  and Metamucil which caused increased bloating.   11/2016 colonoscopy by Dr. Naval: 2 diminutive colon mucosa polyps removed.  Good prep.  10-year repeat (due 11/2026).   06/2023 last labs: Normal CBC, BMP, LFTs and TSH.  Hgb 13.4.   Current Outpatient Medications  Medication Sig Dispense Refill   acetaminophen  (TYLENOL ) 500 MG tablet Take 1,000 mg by mouth daily as needed for headache.     ALPRAZolam  (XANAX ) 0.5 MG tablet Take 1 tablet (0.5 mg total) by mouth 2 (two) times daily as needed for anxiety. 60 tablet 5   augmented betamethasone  dipropionate (DIPROLENE -AF) 0.05 % cream SMARTSIG:Sparingly Topical Twice Daily     escitalopram  (LEXAPRO ) 20 MG tablet Take 1 tablet by mouth once daily 90 tablet 0   famotidine  (PEPCID ) 20 MG tablet Take 1 tablet (20 mg total) by mouth at bedtime.     fexofenadine-pseudoephedrine (ALLEGRA-D 24) 180-240 MG per 24 hr tablet Take 1 tablet by mouth daily as needed (seasonal allergies).      Omega-3 Fatty Acids (FISH OIL PO) Take 1 capsule by mouth daily.     pantoprazole  (PROTONIX ) 40 MG tablet Take 1 tablet by mouth once daily 90 tablet 3   Phenyleph-Doxyl-DM-Aspirin (ALKA-SELTZER PLUS NIGHT COLD) 7.8-6.25-10-500 MG TBEF Take 2 tablets by mouth at bedtime as needed (aches and pains).     rosuvastatin  (CRESTOR ) 5 MG tablet Take 1 tablet by mouth once daily 90 tablet 0   SKYRIZI PEN 150 MG/ML pen Inject 150 mg into the skin every 14 (fourteen) days.  triamcinolone  cream (KENALOG ) 0.1 % Apply 1 Application topically 2 (two) times daily as needed. 30 g 2   No current facility-administered medications for this visit.    Allergies as of 06/06/2024 - Review Complete 06/06/2024  Allergen Reaction Noted   Lipitor [atorvastatin ] Other (See Comments) 06/16/2021   Cashew nut oil Rash 12/02/2012   Penicillins Rash 02/04/2008    Past Medical History:  Diagnosis Date   Allergy    seasonal    Anemia    low iron   Anxiety    panic attack   Cholecystitis with cholelithiasis 01/10/2019   Constipation    Factor V Leiden mutation (HCC) 11/30/2012   Frozen shoulder March 2015   right side, saw Dr. Murrell, resolved with PT    GERD (gastroesophageal reflux disease)    Hemorrhoid     Past Surgical History:  Procedure Laterality Date   ANKLE FRACTURE SURGERY     CHOLECYSTECTOMY N/A 01/10/2019   Procedure: LAPAROSCOPIC CHOLECYSTECTOMY WITH attempted INTRAOPERATIVE CHOLANGIOGRAM;  Surgeon: Gail Favorite, MD;  Location: [facility] OR;  Service: General;  Laterality: N/A;   COLONOSCOPY  12/08/2016   per Dr. Leigh, benign polyps, repeat in 10 yrs    CYST EXCISION  2025   on back   GALLBLADDER SURGERY     TESTICLE SURGERY  2010   decreased blood flow     WISDOM TOOTH EXTRACTION     wrist sprain     right    Review of Systems:    All systems reviewed and negative except where noted in HPI.    Physical Exam:  BP 94/68   Pulse 67   Ht 5' 8 (1.727 m)   Wt 204 lb (92.5 kg)   BMI 31.02 kg/m  No LMP for male patient.  General: Well-nourished, well-developed in no acute distress.  Lungs: Clear to auscultation bilaterally. Non-labored. Heart: Regular rate and rhythm, no murmurs rubs or gallops.  Abdomen: Bowel sounds are normal; Abdomen is Soft; No hepatosplenomegaly, masses or hernias;  No Abdominal Tenderness; No guarding or rebound tenderness. Neuro: Alert and oriented x 3.  Grossly intact.  Psych: Alert and cooperative, normal mood and affect.   Imaging Studies: ARCOLA ROYLENE LIEN SPEECH PATH Result Date: 05/09/2024 Table formatting from the original result was not included. Modified Barium Swallow Study Patient Details Name: Jade Burright MRN: [id number] Date of Birth: 03/28/1966 Today's Date: 05/09/2024 HPI/PMH: HPI: OTHER ATIENZA is a 58 yo male presenting for an OP MBS after complaints of liquids going down the wrong pipe as well as a globus sensation. He reports  frequent throat clearance and mild hoarseness as well as increased eructation. PMH includes anemia, anxiety, cholecystitis with cholelithiasis, GERD Clinical Impression: Clinical Impression: Pt presents with an overall functional oropharyngeal swallow. There is mild vallecular residue which is contributed to by incomplete epiglottic inversion. This also results in consistent trace penetration which is expelled independently (PAS 2) and is considered WFL. Esophageal retention with retrograde backflow was observed when the 13 mm barium tablet was administered with thin liquids. After this occurred, pt coughed and reported chest discomfort. Suspect this is secondary to retrograde bolus flow. Discussed with pt and provided education regarding esophageal precautions. May consider further esophageal w/u, although ongoing SLP f/u is not indicated at this time. Factors that may increase risk of adverse event in presence of aspiration Noe & Lianne 2021): No data recorded Recommendations/Plan: Swallowing Evaluation Recommendations Swallowing Evaluation Recommendations Recommendations: PO diet PO Diet Recommendation: Regular; Thin liquids (Level  0) Liquid Administration via: Cup; Straw Medication Administration: Whole meds with liquid Supervision: Patient able to self-feed Swallowing strategies  : Minimize environmental distractions; Slow rate; Small bites/sips Postural changes: Position pt fully upright for meals; Stay upright 30-60 min after meals Oral care recommendations: Oral care BID (2x/day) Recommended consults: Consider esophageal assessment Treatment Plan Treatment Plan Treatment recommendations: No treatment recommended at this time Follow-up recommendations: No SLP follow up Functional status assessment: Patient has not had a recent decline in their functional status. Recommendations Recommendations for follow up therapy are one component of a multi-disciplinary discharge planning process, led by the attending  physician.  Recommendations may be updated based on patient status, additional functional criteria and insurance authorization. Assessment: Orofacial Exam: Orofacial Exam Oral Cavity: Oral Hygiene: WFL Oral Cavity - Dentition: Adequate natural dentition Orofacial Anatomy: WFL Oral Motor/Sensory Function: WFL Anatomy: Anatomy: Suspected cervical osteophytes Boluses Administered: Boluses Administered Boluses Administered: Thin liquids (Level 0); Mildly thick liquids (Level 2, nectar thick); Moderately thick liquids (Level 3, honey thick); Puree; Solid  Oral Impairment Domain: Oral Impairment Domain Lip Closure: No labial escape Tongue control during bolus hold: Cohesive bolus between tongue to palatal seal Bolus preparation/mastication: Timely and efficient chewing and mashing Bolus transport/lingual motion: Brisk tongue motion Oral residue: Complete oral clearance Location of oral residue : N/A Initiation of pharyngeal swallow : Posterior laryngeal surface of the epiglottis  Pharyngeal Impairment Domain: Pharyngeal Impairment Domain Soft palate elevation: No bolus between soft palate (SP)/pharyngeal wall (PW) Laryngeal elevation: Complete superior movement of thyroid  cartilage with complete approximation of arytenoids to epiglottic petiole Anterior hyoid excursion: Complete anterior movement Epiglottic movement: Partial inversion Laryngeal vestibule closure: Complete, no air/contrast in laryngeal vestibule Pharyngeal stripping wave : Present - complete Pharyngeal contraction (A/P view only): N/A Pharyngoesophageal segment opening: Complete distension and complete duration, no obstruction of flow Tongue base retraction: Trace column of contrast or air between tongue base and PPW Pharyngeal residue: Collection of residue within or on pharyngeal structures Location of pharyngeal residue: Tongue base; Valleculae  Esophageal Impairment Domain: Esophageal Impairment Domain Esophageal clearance upright position: Esophageal  retention with retrograde flow below pharyngoesophageal segment (PES) Pill: Pill Consistency administered: Thin liquids (Level 0) Thin liquids (Level 0): [facility] Penetration/Aspiration Scale Score: Penetration/Aspiration Scale Score 1.  Material does not enter airway: Moderately thick liquids (Level 3, honey thick); Puree; Solid; Pill 2.  Material enters airway, remains ABOVE vocal cords then ejected out: Thin liquids (Level 0); Mildly thick liquids (Level 2, nectar thick) Compensatory Strategies: Compensatory Strategies Compensatory strategies: No   General Information: Caregiver present: No  Diet Prior to this Study: Regular; Thin liquids (Level 0)   Temperature : Normal   Respiratory Status: WFL   Supplemental O2: None (Room air)   History of Recent Intubation: No  Behavior/Cognition: Alert; Cooperative; Pleasant mood Self-Feeding Abilities: Able to self-feed Baseline vocal quality/speech: Normal Volitional Cough: Able to elicit Volitional Swallow: Able to elicit Exam Limitations: No limitations Goal Planning: Prognosis for improved oropharyngeal function: Good Barriers to Reach Goals: Time post onset No data recorded Patient/Family Stated Goal: none stated Consulted and agree with results and recommendations: Patient Pain: Pain Assessment Pain Assessment: No/denies pain End of Session: Start Time:SLP Start Time (ACUTE ONLY): 1106 Stop Time: SLP Stop Time (ACUTE ONLY): 1121 Time Calculation:SLP Time Calculation (min) (ACUTE ONLY): 15 min Charges: SLP Evaluations $ SLP Speech Visit: 1 Visit SLP Evaluations $Outpatient MBS Swallow: 1 Procedure SLP visit diagnosis: SLP Visit Diagnosis: Dysphagia, pharyngeal phase (R13.13) Past Medical History: Past Medical  History: Diagnosis Date  Allergy   seasonal  Anemia   low iron  Anxiety   panic attack  Cholecystitis with cholelithiasis 01/10/2019  Constipation   Factor V Leiden mutation (HCC) 11/30/2012  Frozen shoulder March 2015  right side, saw Dr. Murrell, resolved with PT    GERD (gastroesophageal reflux disease)   Hemorrhoid  Past Surgical History: Past Surgical History: Procedure Laterality Date  ANKLE FRACTURE SURGERY    CHOLECYSTECTOMY N/A 01/10/2019  Procedure: LAPAROSCOPIC CHOLECYSTECTOMY WITH attempted INTRAOPERATIVE CHOLANGIOGRAM;  Surgeon: Gail Favorite, MD;  Location: [facility] OR;  Service: General;  Laterality: N/A;  COLONOSCOPY  12/08/2016  per Dr. Leigh, benign polyps, repeat in 10 yrs   CYST EXCISION  2025  on back  GALLBLADDER SURGERY    TESTICLE SURGERY  2010  decreased blood flow    WISDOM TOOTH EXTRACTION    wrist sprain    right Damien Blumenthal, M.A., CCC-SLP Speech Language Pathology, Acute Rehabilitation Services Secure Chat preferred [phone] 05/09/2024, 11:51 AM   Labs: CBC    Component Value Date/Time   WBC 5.9 06/22/2023 0839   RBC 4.58 06/22/2023 0839   HGB 13.4 06/22/2023 0839   HGB 13.8 12/02/2012 1457   HCT 40.7 06/22/2023 0839   HCT 39.7 12/02/2012 1457   PLT 291.0 06/22/2023 0839   PLT 228 12/02/2012 1457   MCV 88.9 06/22/2023 0839   MCV 88.5 12/02/2012 1457   MCH 29.1 06/28/2022 1306   MCHC 33.0 06/22/2023 0839   RDW 14.1 06/22/2023 0839   RDW 13.2 12/02/2012 1457   LYMPHSABS 2.1 06/22/2023 0839   LYMPHSABS 1.7 12/02/2012 1457   MONOABS 0.6 06/22/2023 0839   MONOABS 0.6 12/02/2012 1457   EOSABS 0.3 06/22/2023 0839   EOSABS 0.1 12/02/2012 1457   BASOSABS 0.1 06/22/2023 0839   BASOSABS 0.0 12/02/2012 1457    CMP     Component Value Date/Time   NA 134 (L) 06/22/2023 0839   K 4.5 06/22/2023 0839   CL 104 06/22/2023 0839   CO2 23 06/22/2023 0839   GLUCOSE 103 (H) 06/22/2023 0839   BUN 14 06/22/2023 0839   CREATININE 0.90 06/22/2023 0839   CREATININE 0.86 08/20/2020 0939   CALCIUM  9.3 06/22/2023 0839   PROT 7.1 06/22/2023 0839   ALBUMIN 4.2 06/22/2023 0839   AST 27 06/22/2023 0839   ALT 24 06/22/2023 0839   ALKPHOS 74 06/22/2023 0839   BILITOT 0.4 06/22/2023 0839   GFRNONAA >60 06/28/2022 1306   GFRAA >60  07/05/2020 1408    Assessment and Plan:   Jamarkus Lisbon is a 58 y.o. y/o male returns for follow-up of:  1.  GERD -improved yet not controlled on PPI.  History of GERD for many years.  Pantoprazole  works better than omeprazole . - Continue pantoprazole  40 Mg every morning, take 30 minutes before breakfast - Add OTC famotidine  20 Mg every evening before bedtime. - Recommend he not eat or drink 2 to 3 hours before bedtime. - Recommend Lifestyle Modifications to prevent Acid Reflux.  Rec. Avoid coffee, sodas, peppermint, garlic, onions, alcohol, citrus fruits, chocolate, tomatoes, fatty and spicey foods.     2.  Dysphagia: Recent Modified barium swallow test showed no aspiration. - Scheduling EGD - Evaluate for Barrett's, EOE, & stricture. I discussed risks of EGD with patient to include risk of bleeding, perforation, and risk of sedation.  Patient expressed understanding and agrees to proceed with EGD.   Ellouise Console, PA-C  Follow up based on EGD results and GI symptoms.

## 2024-06-06 ENCOUNTER — Ambulatory Visit: Admitting: Physician Assistant

## 2024-06-06 ENCOUNTER — Encounter: Payer: Self-pay | Admitting: Physician Assistant

## 2024-06-06 VITALS — BP 94/68 | HR 67 | Ht 68.0 in | Wt 204.0 lb

## 2024-06-06 DIAGNOSIS — K219 Gastro-esophageal reflux disease without esophagitis: Secondary | ICD-10-CM | POA: Diagnosis not present

## 2024-06-06 DIAGNOSIS — R1310 Dysphagia, unspecified: Secondary | ICD-10-CM | POA: Diagnosis not present

## 2024-06-06 MED ORDER — FAMOTIDINE 20 MG PO TABS: 20.0000 mg | ORAL_TABLET | Freq: Every day | ORAL | Status: AC

## 2024-06-06 NOTE — Progress Notes (Signed)
 Agree with assessment and plan as outlined.

## 2024-06-06 NOTE — Patient Instructions (Addendum)
 Continue Pantoprazole  40mg  1 tablet once daily in the morning before breakfast.  Add OTC Pepcid (Famotidine) 20mg  once daily before bedtime if needed for acid reflux.  You have been scheduled for an endoscopy. Please follow written instructions given to you at your visit today.  If you use inhalers (even only as needed), please bring them with you on the day of your procedure.  If you take any of the following medications, they will need to be adjusted prior to your procedure:   DO NOT TAKE 7 DAYS PRIOR TO TEST- Trulicity (dulaglutide) Ozempic, Wegovy (semaglutide) Mounjaro (tirzepatide) Bydureon Bcise (exanatide extended release)  DO NOT TAKE 1 DAY PRIOR TO YOUR TEST Rybelsus (semaglutide) Adlyxin (lixisenatide) Victoza (liraglutide) Byetta (exanatide) ___________________________________________________________________________

## 2024-06-11 ENCOUNTER — Other Ambulatory Visit: Payer: Self-pay | Admitting: Family Medicine

## 2024-06-11 DIAGNOSIS — F411 Generalized anxiety disorder: Secondary | ICD-10-CM

## 2024-06-23 ENCOUNTER — Ambulatory Visit (INDEPENDENT_AMBULATORY_CARE_PROVIDER_SITE_OTHER): Admitting: Family Medicine

## 2024-06-23 ENCOUNTER — Encounter: Payer: Self-pay | Admitting: Family Medicine

## 2024-06-23 VITALS — BP 90/62 | HR 67 | Temp 98.3°F | Ht 68.0 in | Wt 204.6 lb

## 2024-06-23 DIAGNOSIS — Z136 Encounter for screening for cardiovascular disorders: Secondary | ICD-10-CM | POA: Diagnosis not present

## 2024-06-23 DIAGNOSIS — Z0000 Encounter for general adult medical examination without abnormal findings: Secondary | ICD-10-CM

## 2024-06-23 DIAGNOSIS — K219 Gastro-esophageal reflux disease without esophagitis: Secondary | ICD-10-CM | POA: Diagnosis not present

## 2024-06-23 DIAGNOSIS — Z13220 Encounter for screening for lipoid disorders: Secondary | ICD-10-CM | POA: Diagnosis not present

## 2024-06-23 DIAGNOSIS — Z125 Encounter for screening for malignant neoplasm of prostate: Secondary | ICD-10-CM | POA: Diagnosis not present

## 2024-06-23 LAB — CBC WITH DIFFERENTIAL/PLATELET
Basophils Absolute: 0 K/uL (ref 0.0–0.1)
Basophils Relative: 0.6 % (ref 0.0–3.0)
Eosinophils Absolute: 0.2 K/uL (ref 0.0–0.7)
Eosinophils Relative: 3.4 % (ref 0.0–5.0)
HCT: 39.5 % (ref 39.0–52.0)
Hemoglobin: 13.6 g/dL (ref 13.0–17.0)
Lymphocytes Relative: 35.4 % (ref 12.0–46.0)
Lymphs Abs: 2.1 K/uL (ref 0.7–4.0)
MCHC: 34.4 g/dL (ref 30.0–36.0)
MCV: 86.4 fl (ref 78.0–100.0)
Monocytes Absolute: 0.6 K/uL (ref 0.1–1.0)
Monocytes Relative: 9.5 % (ref 3.0–12.0)
Neutro Abs: 3 K/uL (ref 1.4–7.7)
Neutrophils Relative %: 51.1 % (ref 43.0–77.0)
Platelets: 278 K/uL (ref 150.0–400.0)
RBC: 4.57 Mil/uL (ref 4.22–5.81)
RDW: 14.1 % (ref 11.5–15.5)
WBC: 5.9 K/uL (ref 4.0–10.5)

## 2024-06-23 LAB — BASIC METABOLIC PANEL WITH GFR
BUN: 13 mg/dL (ref 6–23)
CO2: 29 meq/L (ref 19–32)
Calcium: 9.3 mg/dL (ref 8.4–10.5)
Chloride: 100 meq/L (ref 96–112)
Creatinine, Ser: 1.11 mg/dL (ref 0.40–1.50)
GFR: 73.64 mL/min (ref >60.00)
Glucose, Bld: 93 mg/dL (ref 70–99)
Potassium: 4.6 meq/L (ref 3.5–5.1)
Sodium: 135 meq/L (ref 135–145)

## 2024-06-23 LAB — PSA: PSA: 0.35 ng/mL (ref 0.10–4.00)

## 2024-06-23 LAB — HEPATIC FUNCTION PANEL
ALT: 27 U/L (ref 0–53)
AST: 29 U/L (ref 0–37)
Albumin: 4.5 g/dL (ref 3.5–5.2)
Alkaline Phosphatase: 76 U/L (ref 39–117)
Bilirubin, Direct: 0.1 mg/dL (ref 0.0–0.3)
Total Bilirubin: 0.4 mg/dL (ref 0.2–1.2)
Total Protein: 7.3 g/dL (ref 6.0–8.3)

## 2024-06-23 LAB — LIPID PANEL
Cholesterol: 161 mg/dL (ref 0–200)
HDL: 39.6 mg/dL (ref >39.00)
LDL Cholesterol: 88 mg/dL (ref 0–99)
NonHDL: 121.52
Total CHOL/HDL Ratio: 4
Triglycerides: 166 mg/dL — ABNORMAL HIGH (ref 0.0–149.0)
VLDL: 33.2 mg/dL (ref 0.0–40.0)

## 2024-06-23 LAB — HEMOGLOBIN A1C: Hgb A1c MFr Bld: 5.9 % (ref 4.6–6.5)

## 2024-06-23 LAB — TSH: TSH: 2.52 u[IU]/mL (ref 0.35–5.50)

## 2024-06-23 MED ORDER — ALPRAZOLAM 0.5 MG PO TABS: 0.5000 mg | ORAL_TABLET | Freq: Two times a day (BID) | ORAL | 5 refills | Status: AC | PRN

## 2024-06-23 MED ORDER — PANTOPRAZOLE SODIUM 40 MG PO TBEC
40.0000 mg | DELAYED_RELEASE_TABLET | Freq: Every day | ORAL | Status: AC
Start: 2024-06-23 — End: ?

## 2024-06-23 NOTE — Progress Notes (Signed)
 Subjective:    Patient ID: Vertis Scheib, male    DOB: 1966/02/06, 58 y.o.   MRN: [id number]  HPI Here for a well exam. He feels well in general. He had been having a lot of night time GERD symptoms, but after adding Famotidine  in the evenings to the Pantoprazole  in the mornings, this is now fairly well controlled. He had a normal swallowing study. He is also scheduled for an EGD on 08-01-24. He asks about screening for heart disease because he has been worrying about this. No chest pain or SOB.    Review of Systems  Constitutional: Negative.   HENT: Negative.    Eyes: Negative.   Respiratory: Negative.    Cardiovascular: Negative.   Gastrointestinal: Negative.   Genitourinary: Negative.   Musculoskeletal: Negative.   Skin: Negative.   Neurological: Negative.   Psychiatric/Behavioral: Negative.         Objective:   Physical Exam Constitutional:      General: He is not in acute distress.    Appearance: Normal appearance. He is well-developed. He is not diaphoretic.  HENT:     Head: Normocephalic and atraumatic.     Right Ear: External ear normal.     Left Ear: External ear normal.     Nose: Nose normal.     Mouth/Throat:     Pharynx: No oropharyngeal exudate.  Eyes:     General: No scleral icterus.       Right eye: No discharge.        Left eye: No discharge.     Conjunctiva/sclera: Conjunctivae normal.     Pupils: Pupils are equal, round, and reactive to light.  Neck:     Thyroid : No thyromegaly.     Vascular: No JVD.     Trachea: No tracheal deviation.  Cardiovascular:     Rate and Rhythm: Normal rate and regular rhythm.     Pulses: Normal pulses.     Heart sounds: Normal heart sounds. No murmur heard.    No friction rub. No gallop.  Pulmonary:     Effort: Pulmonary effort is normal. No respiratory distress.     Breath sounds: Normal breath sounds. No wheezing or rales.  Chest:     Chest wall: No tenderness.  Abdominal:     General: Bowel sounds are  normal. There is no distension.     Palpations: Abdomen is soft. There is no mass.     Tenderness: There is no abdominal tenderness. There is no guarding or rebound.  Genitourinary:    Penis: Normal. No tenderness.      Testes: Normal.     Prostate: Normal.     Rectum: Normal. Guaiac result negative.  Musculoskeletal:        General: No tenderness. Normal range of motion.     Cervical back: Neck supple.  Lymphadenopathy:     Cervical: No cervical adenopathy.  Skin:    General: Skin is warm and dry.     Coloration: Skin is not pale.     Findings: No erythema or rash.  Neurological:     General: No focal deficit present.     Mental Status: He is alert and oriented to person, place, and time.     Cranial Nerves: No cranial nerve deficit.     Motor: No abnormal muscle tone.     Coordination: Coordination normal.     Deep Tendon Reflexes: Reflexes are normal and symmetric. Reflexes normal.  Psychiatric:  Mood and Affect: Mood normal.        Behavior: Behavior normal.        Thought Content: Thought content normal.        Judgment: Judgment normal.           Assessment & Plan:  Well exam. We discussed diet and exercise. Get fasting labs. We will also set up a cardiac calcium  CT test.  Garnette Olmsted, MD

## 2024-06-24 ENCOUNTER — Ambulatory Visit: Payer: Self-pay | Admitting: Family Medicine

## 2024-06-24 NOTE — Telephone Encounter (Signed)
 This is only mildly elevated so no medication is needed. He can just limit his intake of carbs and sugars

## 2024-07-15 ENCOUNTER — Ambulatory Visit (HOSPITAL_BASED_OUTPATIENT_CLINIC_OR_DEPARTMENT_OTHER)
Admission: RE | Admit: 2024-07-15 | Discharge: 2024-07-15 | Disposition: A | Discharge status: Home / Self Care | Payer: Self-pay | Source: Ambulatory Visit | Attending: Family Medicine | Admitting: Family Medicine

## 2024-07-15 DIAGNOSIS — Z136 Encounter for screening for cardiovascular disorders: Secondary | ICD-10-CM | POA: Insufficient documentation

## 2024-07-24 ENCOUNTER — Encounter: Payer: Self-pay | Admitting: Gastroenterology

## 2024-08-01 ENCOUNTER — Encounter: Payer: Self-pay | Admitting: Gastroenterology

## 2024-08-01 ENCOUNTER — Encounter: Payer: Self-pay | Admitting: Family Medicine

## 2024-08-01 ENCOUNTER — Ambulatory Visit (AMBULATORY_SURGERY_CENTER): Admitting: Gastroenterology

## 2024-08-01 VITALS — BP 94/58 | HR 57 | Temp 97.6°F | Resp 9 | Ht 68.0 in | Wt 204.0 lb

## 2024-08-01 DIAGNOSIS — K449 Diaphragmatic hernia without obstruction or gangrene: Secondary | ICD-10-CM | POA: Diagnosis not present

## 2024-08-01 DIAGNOSIS — R1310 Dysphagia, unspecified: Secondary | ICD-10-CM

## 2024-08-01 DIAGNOSIS — K219 Gastro-esophageal reflux disease without esophagitis: Secondary | ICD-10-CM

## 2024-08-01 DIAGNOSIS — K2289 Other specified disease of esophagus: Secondary | ICD-10-CM | POA: Diagnosis present

## 2024-08-01 MED ORDER — SODIUM CHLORIDE 0.9 % IV SOLN: 500.0000 mL | Freq: Once | INTRAVENOUS | Status: DC

## 2024-08-01 NOTE — Progress Notes (Signed)
 849 Robinul 0.1 mg IV given due large amount of secretions upon assessment.  MD made aware, vss

## 2024-08-01 NOTE — Progress Notes (Signed)
 Pt's states no medical or surgical changes since previsit or office visit.

## 2024-08-01 NOTE — Progress Notes (Signed)
 Report given to PACU, vss

## 2024-08-01 NOTE — Patient Instructions (Signed)

## 2024-08-01 NOTE — Progress Notes (Signed)
 [facility] History and Physical   Primary Care Physician:  Johnny Garnette LABOR, MD   Reason for Procedure:   GERD, dysphagia  Plan:    EGD     HPI: Jackson Smith is a 58 y.o. male  here for EGD to evaluate history of GERD, dysphagia. Now on protonix  40mg  / day and pepcid  - symptoms had been improved but not resolved. No prior EGD. States dysphagia has since resolved since office visit and his reflux has been doing much better     Otherwise feels well without any cardiopulmonary symptoms.   I have discussed risks / benefits of anesthesia and endoscopic procedure with Jackson Smith and they wish to proceed with the exams as outlined today.    Past Medical History:  Diagnosis Date   Allergy    seasonal   Anemia    low iron   Anxiety    panic attack   Cholecystitis with cholelithiasis 01/10/2019   Constipation    Factor V Leiden mutation (HCC) 11/30/2012   Frozen shoulder March 2015   right side, saw Dr. Murrell, resolved with PT    GERD (gastroesophageal reflux disease)    Hemorrhoid     Past Surgical History:  Procedure Laterality Date   ANKLE FRACTURE SURGERY     CHOLECYSTECTOMY N/A 01/10/2019   Procedure: LAPAROSCOPIC CHOLECYSTECTOMY WITH attempted INTRAOPERATIVE CHOLANGIOGRAM;  Surgeon: Gail Favorite, MD;  Location: [facility] OR;  Service: General;  Laterality: N/A;   COLONOSCOPY  12/08/2016   per Dr. Leigh, benign polyps, repeat in 10 yrs    CYST EXCISION  2025   on back   GALLBLADDER SURGERY     TESTICLE SURGERY  2010   decreased blood flow     WISDOM TOOTH EXTRACTION     wrist sprain     right    Prior to Admission medications   Medication Sig Start Date End Date Taking? Authorizing Provider  acetaminophen  (TYLENOL ) 500 MG tablet Take 1,000 mg by mouth daily as needed for headache.   Yes [provider]  augmented betamethasone  dipropionate (DIPROLENE -AF) 0.05 % cream SMARTSIG:Sparingly Topical Twice Daily 01/19/23  Yes  [provider]  escitalopram  (LEXAPRO ) 20 MG tablet Take 1 tablet by mouth once daily 06/11/24  Yes Johnny Garnette LABOR, MD  famotidine  (PEPCID ) 20 MG tablet Take 1 tablet (20 mg total) by mouth at bedtime. 06/06/24  Yes [place], PA-C  fexofenadine-pseudoephedrine (ALLEGRA-D 24) 180-240 MG per 24 hr tablet Take 1 tablet by mouth daily as needed (seasonal allergies).    Yes [provider]  pantoprazole  (PROTONIX ) 40 MG tablet Take 1 tablet (40 mg total) by mouth daily. 06/23/24  Yes Johnny Garnette LABOR, MD  rosuvastatin  (CRESTOR ) 5 MG tablet Take 1 tablet by mouth once daily 06/04/24  Yes Johnny Garnette LABOR, MD  triamcinolone  cream (KENALOG ) 0.1 % Apply 1 Application topically 2 (two) times daily as needed. 07/24/22  Yes Johnny Garnette LABOR, MD  ALPRAZolam  (XANAX ) 0.5 MG tablet Take 1 tablet (0.5 mg total) by mouth 2 (two) times daily as needed for anxiety. 06/23/24   Johnny Garnette LABOR, MD  Omega-3 Fatty Acids (FISH OIL PO) Take 1 capsule by mouth daily.    [provider]  Phenyleph-Doxyl-DM-Aspirin (ALKA-SELTZER PLUS NIGHT COLD) 7.8-6.25-10-500 MG TBEF Take 2 tablets by mouth at bedtime as needed (aches and pains).    [provider]  SKYRIZI PEN 150 MG/ML pen Inject 150 mg into the skin every 14 (fourteen) days. 05/19/24  [provider]    Current Outpatient Medications  Medication Sig Dispense Refill   acetaminophen  (TYLENOL ) 500 MG tablet Take 1,000 mg by mouth daily as needed for headache.     augmented betamethasone  dipropionate (DIPROLENE -AF) 0.05 % cream SMARTSIG:Sparingly Topical Twice Daily     escitalopram  (LEXAPRO ) 20 MG tablet Take 1 tablet by mouth once daily 90 tablet 0   famotidine  (PEPCID ) 20 MG tablet Take 1 tablet (20 mg total) by mouth at bedtime.     fexofenadine-pseudoephedrine (ALLEGRA-D 24) 180-240 MG per 24 hr tablet Take 1 tablet by mouth daily as needed (seasonal allergies).      pantoprazole  (PROTONIX ) 40 MG tablet Take 1 tablet (40 mg total) by  mouth daily.     rosuvastatin  (CRESTOR ) 5 MG tablet Take 1 tablet by mouth once daily 90 tablet 0   triamcinolone  cream (KENALOG ) 0.1 % Apply 1 Application topically 2 (two) times daily as needed. 30 g 2   ALPRAZolam  (XANAX ) 0.5 MG tablet Take 1 tablet (0.5 mg total) by mouth 2 (two) times daily as needed for anxiety. 60 tablet 5   Omega-3 Fatty Acids (FISH OIL PO) Take 1 capsule by mouth daily.     Phenyleph-Doxyl-DM-Aspirin (ALKA-SELTZER PLUS NIGHT COLD) 7.8-6.25-10-500 MG TBEF Take 2 tablets by mouth at bedtime as needed (aches and pains).     SKYRIZI PEN 150 MG/ML pen Inject 150 mg into the skin every 14 (fourteen) days.     Current Facility-Administered Medications  Medication Dose Route Frequency Provider Last Rate Last Admin   0.9 %  sodium chloride  infusion  500 mL Intravenous Once Antony Sian, Elspeth SQUIBB, MD        Allergies as of 08/01/2024 - Review Complete 08/01/2024  Allergen Reaction Noted   Cashew nut oil Rash 12/02/2012   Lipitor [atorvastatin ] Other (See Comments) 06/16/2021   Penicillins Rash 02/04/2008    Family History  Problem Relation Age of Onset   Factor V Leiden deficiency Mother    Heart disease Father    Diabetes Father    Heart attack Father    Kidney failure Father    Kidney disease Father    Breast cancer Sister    Birth defects Brother    Factor V Leiden deficiency Brother    Factor V Leiden deficiency Maternal Aunt    Colon cancer Maternal Aunt    Cancer Maternal Uncle 60       colon cancer   Colon cancer Maternal Uncle 60       60's   Esophageal cancer Neg Hx    Pancreatic cancer Neg Hx    Stomach cancer Neg Hx     Social History   Socioeconomic History   Marital status: Single    Spouse name: Not on file   Number of children: 0   Years of education: Not on file   Highest education level: Bachelor's degree (e.g., BA, AB, BS)  Occupational History    Employer: [employer]  Tobacco Use   Smoking status: Never   Smokeless  tobacco: Never  Vaping Use   Vaping status: Never Used  Substance and Sexual Activity   Alcohol use: No    Alcohol/week: 0.0 standard drinks of alcohol   Drug use: No   Sexual activity: Not on file  Other Topics Concern   Not on file  Social History Narrative   Not on file   Social Drivers of Health   Financial Resource Strain: Low Risk  (06/19/2024)  Overall Financial Resource Strain (CARDIA)    Difficulty of Paying Living Expenses: Not hard at all  Food Insecurity: No Food Insecurity (06/19/2024)   Hunger Vital Sign    Worried About Running Out of Food in the Last Year: Never true    Ran Out of Food in the Last Year: Never true  Transportation Needs: No Transportation Needs (06/19/2024)   PRAPARE - Administrator, Civil Service (Medical): No    Lack of Transportation (Non-Medical): No  Physical Activity: Inactive (06/19/2024)   Exercise Vital Sign    Days of Exercise per Week: 0 days    Minutes of Exercise per Session: Not on file  Stress: No Stress Concern Present (06/19/2024)   [employer] of Occupational Health - Occupational Stress Questionnaire    Feeling of Stress: Only a little  Social Connections: Socially Isolated (06/19/2024)   Social Connection and Isolation Panel    Frequency of Communication with Friends and Family: More than three times a week    Frequency of Social Gatherings with Friends and Family: More than three times a week    Attends Religious Services: Patient declined    Database administrator or Organizations: No    Attends Engineer, structural: Not on file    Marital Status: Never married  Intimate Partner Violence: Unknown (07/20/2023)   Received from [facility]   HITS    Physically Hurt: Not on file    Insult or Talk Down To: Not on file    Threaten Physical Harm: Not on file    Scream or Curse: Not on file    Review of Systems: All other review of systems negative except as mentioned in the HPI.  Physical Exam: Vital  signs BP 112/76   Pulse 69   Temp 97.6 F (36.4 C) (Skin)   Ht 5' 8 (1.727 m)   Wt 204 lb (92.5 kg)   SpO2 97%   BMI 31.02 kg/m   General:   Alert,  Well-developed, pleasant and cooperative in NAD Lungs:  Clear throughout to auscultation.   Heart:  Regular rate and rhythm Abdomen:  Soft, nontender and nondistended.   Neuro/Psych:  Alert and cooperative. Normal mood and affect. A and O x 3  Marcey Naval, MD [facility]

## 2024-08-01 NOTE — Op Note (Signed)
 [facility] Patient Name: Demitris Pokorny Procedure Date: 08/01/2024 8:39 AM MRN: [id number] Endoscopist: Elspeth P. Leigh , MD, [id number] Age: 58 Referring MD:  Date of Birth: 12/10/1966 Gender: Male Account #: [id number] Procedure:                Upper GI endoscopy Indications:              Dysphagia, history of gastro-esophageal reflux                            disease - now on protonix  40mg  / day - has                            seemingly resolved symptoms since office visit. No                            further dysphagia, no significant breakthrough of                            GERD. Rule out Barrett's Medicines:                Monitored Anesthesia Care Procedure:                Pre-Anesthesia Assessment:                           - Prior to the procedure, a History and Physical                            was performed, and patient medications and                            allergies were reviewed. The patient's tolerance of                            previous anesthesia was also reviewed. The risks                            and benefits of the procedure and the sedation                            options and risks were discussed with the patient.                            All questions were answered, and informed consent                            was obtained. Prior Anticoagulants: The patient has                            taken no anticoagulant or antiplatelet agents. ASA                            Grade Assessment: II - A patient with mild systemic  disease. After reviewing the risks and benefits,                            the patient was deemed in satisfactory condition to                            undergo the procedure.                           After obtaining informed consent, the endoscope was                            passed under direct vision. Throughout the                            procedure, the patient's blood  pressure, pulse, and                            oxygen saturations were monitored continuously. The                            GIF [id number] #[id number] was introduced through the                            mouth, and advanced to the second part of duodenum.                            The upper GI endoscopy was accomplished without                            difficulty. The patient tolerated the procedure                            well. Scope In: Scope Out: Findings:                 Esophagogastric landmarks were identified: the                            Z-line was found at 39 cm, the gastroesophageal                            junction was found at 39 cm and the upper extent of                            the gastric folds was found at 40 cm from the                            incisors.                           A 1 cm hiatal hernia was present.                           The Z-line was slightly irregular but  did not meet                            criteria for Barrett's.                           The exam of the esophagus was otherwise normal. No                            focal stenosis / stricture appreciated. No                            inflammatory changes.                           The entire examined stomach was normal.                           The examined duodenum was normal. Complications:            No immediate complications. Estimated blood loss:                            None. Estimated Blood Loss:     Estimated blood loss: none. Impression:               - Esophagogastric landmarks identified.                           - 1 cm hiatal hernia.                           - Slightly irregular Z-line - did not meet critiera                            for Barrett's.                           - Normal esophagus - no stenosis / stricture, or                            inflammatory changes                           - Normal stomach.                           - Normal examined  duodenum.                           Overall, good response to protonix . No Barrett's or                            obvious stricture. Long term, use the lowest dose                            of PPI needed to control symptoms. Recommendation:           -  Patient has a contact number available for                            emergencies. The signs and symptoms of potential                            delayed complications were discussed with the                            patient. Return to normal activities tomorrow.                            Written discharge instructions were provided to the                            patient.                           - Resume previous diet.                           - Continue present medications. Elspeth P. Katheline Brendlinger, MD 08/01/2024 9:04:39 AM This report has been signed electronically.

## 2024-08-04 ENCOUNTER — Telehealth: Payer: Self-pay

## 2024-08-04 NOTE — Telephone Encounter (Signed)
 Follow up call to pt, lm for pt to call if having any difficulty with normal activities or eating and drinking.  Also to call if any other questions or concerns.

## 2024-09-01 ENCOUNTER — Other Ambulatory Visit: Payer: Self-pay | Admitting: Family Medicine

## 2024-09-01 DIAGNOSIS — F411 Generalized anxiety disorder: Secondary | ICD-10-CM

## 2024-10-15 ENCOUNTER — Ambulatory Visit (INDEPENDENT_AMBULATORY_CARE_PROVIDER_SITE_OTHER): Admitting: Family Medicine

## 2024-10-15 ENCOUNTER — Ambulatory Visit: Payer: Self-pay

## 2024-10-15 VITALS — BP 108/80 | HR 74 | Temp 98.1°F | Wt 207.3 lb

## 2024-10-15 DIAGNOSIS — G5712 Meralgia paresthetica, left lower limb: Secondary | ICD-10-CM

## 2024-10-15 NOTE — Progress Notes (Signed)
 Established Patient Office Visit  Subjective   Patient ID: Jackson Smith, male    DOB: [birth year]  Age: 58 y.o. MRN: [id number]  Chief Complaint  Patient presents with   Numbness   Tingling    HPI    Jackson Smith is seen with left upper lateral thigh numbness and tingling over the past month or so.  Denies any injury.  Denies any low back pain.  Symptoms are worse with prolonged standing or walking and improves when sitting down.  Denies any lower leg involvement.  No urine or stool incontinence.  No history of similar symptoms.  No right lower extremity symptoms.  He has some very mild pain around the lateral femoral cutaneous nerve in the pelvic region Does not wear any tool belts or any other significant compression over this region.  Past Medical History:  Diagnosis Date   Allergy    seasonal   Anemia    low iron   Anxiety    panic attack   Cholecystitis with cholelithiasis 01/10/2019   Constipation    Factor V Leiden mutation 11/30/2012   Frozen shoulder March 2015   right side, saw Dr. Murrell, resolved with PT    GERD (gastroesophageal reflux disease)    Hemorrhoid    Past Surgical History:  Procedure Laterality Date   ANKLE FRACTURE SURGERY     CHOLECYSTECTOMY N/A 01/10/2019   Procedure: LAPAROSCOPIC CHOLECYSTECTOMY WITH attempted INTRAOPERATIVE CHOLANGIOGRAM;  Surgeon: Gail Favorite, MD;  Location: [facility] OR;  Service: General;  Laterality: N/A;   COLONOSCOPY  12/08/2016   per Dr. Leigh, benign polyps, repeat in 10 yrs    CYST EXCISION  2025   on back   GALLBLADDER SURGERY     TESTICLE SURGERY  2010   decreased blood flow     WISDOM TOOTH EXTRACTION     wrist sprain     right    reports that he has never smoked. He has never used smokeless tobacco. He reports that he does not drink alcohol and does not use drugs. family history includes Birth defects in his brother; Breast cancer in his sister; Cancer (age of onset: 38) in his maternal uncle; Colon cancer  in his maternal aunt; Colon cancer (age of onset: 19) in his maternal uncle; Diabetes in his father; Factor V Leiden deficiency in his brother, maternal aunt, and mother; Heart attack in his father; Heart disease in his father; Kidney disease in his father; Kidney failure in his father. Allergies  Allergen Reactions   Cashew Nut Oil Rash   Lipitor [Atorvastatin ] Other (See Comments)    Myalgia    Penicillins Rash    Did it involve swelling of the face/tongue/throat, SOB, or low BP? No Did it involve sudden or severe rash/hives, skin peeling, or any reaction on the inside of your mouth or nose? No Did you need to seek medical attention at a hospital or doctor's office? No When did it last happen?      childhood allergy If all above answers are NO, may proceed with cephalosporin use.     Review of Systems  Constitutional:  Negative for chills and fever.  Musculoskeletal:  Negative for back pain.  Neurological:  Positive for tingling and sensory change. Negative for focal weakness.      Objective:     BP 108/80   Pulse 74   Temp 98.1 F (36.7 C) (Oral)   Wt 207 lb 4.8 oz (94 kg)   SpO2 96%   BMI  31.52 kg/m  BP Readings from Last 3 Encounters:  10/15/24 108/80  08/01/24 (!) 94/58  06/23/24 90/62   Wt Readings from Last 3 Encounters:  10/15/24 207 lb 4.8 oz (94 kg)  08/01/24 204 lb (92.5 kg)  06/23/24 204 lb 9.6 oz (92.8 kg)      Physical Exam Vitals reviewed.  Constitutional:      General: He is not in acute distress.    Appearance: He is not ill-appearing.  Cardiovascular:     Rate and Rhythm: Normal rate and regular rhythm.  Musculoskeletal:     Right lower leg: No edema.     Left lower leg: No edema.     Comments: Excellent range of motion left hip.  Neurological:     Mental Status: He is alert.     Comments: He has full strength lower extremity with plantarflexion, dorsiflexion, knee extension, and hip flexion bilaterally.  Deep tendon reflexes are 2+ knee  and ankle bilaterally.  He has normal sensory function throughout the lower leg and slightly altered but present sensation to touch left lateral thigh      No results found for any visits on 10/15/24.    The 10-year ASCVD risk score (Arnett DK, et al., 2019) is: 4.8%    Assessment & Plan:   Problem List Items Addressed This Visit   None Visit Diagnoses       Meralgia paresthetica of left side    -  Primary     Patient has fairly classic lateral femoral cutaneous nerve entrapment left lateral thigh.  Nonfocal neuroexam.  We explained this is usually self-limited.  No specific therapy needed.  Follow-up for any progressive pain or any new symptoms such as weakness  No follow-ups on file.    Wolm Scarlet, MD

## 2024-10-15 NOTE — Telephone Encounter (Signed)
 Pt had a visit with Dr Micheal this afternoon for this problem

## 2024-10-15 NOTE — Telephone Encounter (Signed)
 FYI Only or Action Required?: FYI only for provider: appointment scheduled on 10/29.  Patient was last seen in primary care on 06/23/2024 by Jackson Smith LABOR, MD.  Called Nurse Triage reporting Numbness.  Symptoms began about a month ago.  Interventions attempted: Rest, hydration, or home remedies.  Symptoms are: unchanged.  Triage Disposition: See HCP Within 4 Hours (Or PCP Triage)  Patient/caregiver understands and will follow disposition?: Yes      Copied from CRM [id number]. Topic: Clinical - Red Word Triage >> Oct 15, 2024 10:45 AM Mesmerise C wrote: [facility] that prompted transfer to Nurse Triage: Patient stated he's been having a burning and tingling sensation goes numb in his left leg happens when his standing around but when he sits down it goes away sometimes been ongoing for awhile now believes for about a month Reason for Disposition  [1] Numbness (i.e., loss of sensation) of the face, arm / hand, or leg / foot on one side of the body AND [2] gradual onset (e.g., days to weeks) AND [3] present now  Answer Assessment - Initial Assessment Questions 1. SYMPTOM: What is the main symptom you are concerned about? (e.g., weakness, numbness)      Numbness tingling in thigh on left leg intermittent   2. ONSET: When did this start? (e.g., minutes, hours, days; while sleeping)     X 1 month   3. LAST NORMAL: When was the last time you (the patient) were normal (no symptoms)?     Over a month ago   4. PATTERN Does this come and go, or has it been constant since it started?  Is it present now?     Intermittent   5. CARDIAC SYMPTOMS: Have you had any of the following symptoms: chest pain, difficulty breathing, palpitations?     No   6. NEUROLOGIC SYMPTOMS: Have you had any of the following symptoms: headache, dizziness, vision loss, double vision, changes in speech, unsteady on your feet?     No   7. OTHER SYMPTOMS: Do you have any other symptoms? No     Patient has intermittent numbness tingling in his left thigh x 1 month, no other symptoms noted. Symptoms dissipate when sitting. Patient is unsure of what is causing the symptoms  Protocols used: Neurologic Deficit-A-AH

## 2024-10-15 NOTE — Patient Instructions (Signed)
 Suspect you have Meralgia Paresthetica (lateral femoral cutaneus nerve entrapment)  This is usually self-limited and does not require any treatment.

## 2024-11-25 ENCOUNTER — Other Ambulatory Visit: Payer: Self-pay | Admitting: Family Medicine

## 2024-11-25 DIAGNOSIS — F411 Generalized anxiety disorder: Secondary | ICD-10-CM
